# Patient Record
Sex: Female | Born: 1935 | Race: Black or African American | Hispanic: No | Smoking: Never smoker
Health system: Southern US, Community
[De-identification: ages and names within clinical notes are randomized; demographics above are authoritative.]

## PROBLEM LIST (undated history)

## (undated) DIAGNOSIS — E785 Hyperlipidemia, unspecified: Secondary | ICD-10-CM

## (undated) DIAGNOSIS — I10 Essential (primary) hypertension: Secondary | ICD-10-CM

## (undated) DIAGNOSIS — H9190 Unspecified hearing loss, unspecified ear: Secondary | ICD-10-CM

## (undated) DIAGNOSIS — R000 Tachycardia, unspecified: Secondary | ICD-10-CM

## (undated) HISTORY — DX: Tachycardia, unspecified: R00.0

## (undated) HISTORY — DX: Unspecified hearing loss, unspecified ear: H91.90

## (undated) HISTORY — DX: Hyperlipidemia, unspecified: E78.5

## (undated) HISTORY — PX: CATARACT EXTRACTION: SUR2

---

## 1997-09-22 ENCOUNTER — Other Ambulatory Visit: Admission: RE | Admit: 1997-09-22 | Discharge: 1997-09-22 | Payer: Self-pay | Admitting: Obstetrics & Gynecology

## 1998-09-28 ENCOUNTER — Other Ambulatory Visit: Admission: RE | Admit: 1998-09-28 | Discharge: 1998-09-28 | Payer: Self-pay | Admitting: Obstetrics & Gynecology

## 1999-11-26 ENCOUNTER — Other Ambulatory Visit: Admission: RE | Admit: 1999-11-26 | Discharge: 1999-11-26 | Payer: Self-pay | Admitting: Obstetrics & Gynecology

## 2000-01-10 ENCOUNTER — Encounter: Payer: Self-pay | Admitting: Obstetrics & Gynecology

## 2000-01-10 ENCOUNTER — Encounter: Admission: RE | Admit: 2000-01-10 | Discharge: 2000-01-10 | Payer: Self-pay | Admitting: Obstetrics & Gynecology

## 2000-09-01 ENCOUNTER — Encounter (INDEPENDENT_AMBULATORY_CARE_PROVIDER_SITE_OTHER): Payer: Self-pay | Admitting: Specialist

## 2000-09-01 ENCOUNTER — Ambulatory Visit (HOSPITAL_COMMUNITY): Admission: RE | Admit: 2000-09-01 | Discharge: 2000-09-01 | Payer: Self-pay | Admitting: Obstetrics & Gynecology

## 2000-12-25 ENCOUNTER — Other Ambulatory Visit: Admission: RE | Admit: 2000-12-25 | Discharge: 2000-12-25 | Payer: Self-pay | Admitting: Obstetrics & Gynecology

## 2001-01-11 ENCOUNTER — Encounter: Admission: RE | Admit: 2001-01-11 | Discharge: 2001-01-11 | Payer: Self-pay | Admitting: Obstetrics & Gynecology

## 2001-01-11 ENCOUNTER — Encounter: Payer: Self-pay | Admitting: Obstetrics & Gynecology

## 2001-05-26 ENCOUNTER — Emergency Department (HOSPITAL_COMMUNITY): Admission: EM | Admit: 2001-05-26 | Discharge: 2001-05-26 | Payer: Self-pay | Admitting: Emergency Medicine

## 2001-05-26 ENCOUNTER — Encounter: Payer: Self-pay | Admitting: Emergency Medicine

## 2002-02-07 ENCOUNTER — Encounter: Admission: RE | Admit: 2002-02-07 | Discharge: 2002-02-07 | Payer: Self-pay | Admitting: Obstetrics & Gynecology

## 2002-02-07 ENCOUNTER — Encounter: Payer: Self-pay | Admitting: Obstetrics & Gynecology

## 2002-02-07 ENCOUNTER — Other Ambulatory Visit: Admission: RE | Admit: 2002-02-07 | Discharge: 2002-02-07 | Payer: Self-pay | Admitting: Obstetrics & Gynecology

## 2003-02-09 ENCOUNTER — Encounter: Admission: RE | Admit: 2003-02-09 | Discharge: 2003-02-09 | Payer: Self-pay | Admitting: Obstetrics & Gynecology

## 2003-02-24 ENCOUNTER — Other Ambulatory Visit: Admission: RE | Admit: 2003-02-24 | Discharge: 2003-02-24 | Payer: Self-pay | Admitting: Obstetrics & Gynecology

## 2004-02-16 ENCOUNTER — Encounter: Admission: RE | Admit: 2004-02-16 | Discharge: 2004-02-16 | Payer: Self-pay | Admitting: Obstetrics & Gynecology

## 2005-03-24 ENCOUNTER — Encounter: Admission: RE | Admit: 2005-03-24 | Discharge: 2005-03-24 | Payer: Self-pay | Admitting: Obstetrics & Gynecology

## 2005-05-09 ENCOUNTER — Other Ambulatory Visit: Admission: RE | Admit: 2005-05-09 | Discharge: 2005-05-09 | Payer: Self-pay | Admitting: Obstetrics & Gynecology

## 2006-03-26 ENCOUNTER — Encounter: Admission: RE | Admit: 2006-03-26 | Discharge: 2006-03-26 | Payer: Self-pay | Admitting: Internal Medicine

## 2007-04-20 ENCOUNTER — Encounter: Admission: RE | Admit: 2007-04-20 | Discharge: 2007-04-20 | Payer: Self-pay | Admitting: Internal Medicine

## 2008-05-05 ENCOUNTER — Encounter: Admission: RE | Admit: 2008-05-05 | Discharge: 2008-05-05 | Payer: Self-pay | Admitting: Obstetrics & Gynecology

## 2009-05-25 ENCOUNTER — Encounter: Admission: RE | Admit: 2009-05-25 | Discharge: 2009-05-25 | Payer: Self-pay | Admitting: Obstetrics & Gynecology

## 2010-06-05 ENCOUNTER — Other Ambulatory Visit: Payer: Self-pay | Admitting: Obstetrics & Gynecology

## 2010-06-05 DIAGNOSIS — Z1231 Encounter for screening mammogram for malignant neoplasm of breast: Secondary | ICD-10-CM

## 2010-06-14 ENCOUNTER — Ambulatory Visit
Admission: RE | Admit: 2010-06-14 | Discharge: 2010-06-14 | Disposition: A | Discharge status: Home / Self Care | Payer: BC Managed Care – PPO | Source: Ambulatory Visit | Attending: Obstetrics & Gynecology | Admitting: Obstetrics & Gynecology

## 2010-06-14 DIAGNOSIS — Z1231 Encounter for screening mammogram for malignant neoplasm of breast: Secondary | ICD-10-CM

## 2010-08-30 NOTE — Op Note (Signed)
[facility]  Patient:    Kristie Hicks, Kristie Hicks                    MRN: [id number] Proc. Date: 09/01/00 Adm. Date:  [id number] Attending:  Minette Headland                           Operative Report  PREOPERATIVE DIAGNOSIS:       Postmenopausal bleeding, endometrial thickening by ultrasound, known uterine leiomyomata which were intramural.  POSTOPERATIVE DIAGNOSIS:      Postmenopausal bleeding, endometrial thickening by ultrasound, known uterine leiomyomata which were intramural with large endometrial polyp which prolapsed into the endocervical canal.  OPERATION:                    Hysteroscopy dilation and curettage.  SURGEON:                      Freddy Finner, M.D.  ANESTHESIA:                   ______ intravenous sedation and paracervical block.  ESTIMATED BLOOD LOSS:         10 cc.  ______ -40 cc.  INDICATIONS:                  The patient is a 77 year old with an episode of postmenopausal spotting.  Evaluation on ultrasound had thickening of the endometrium to 6.4 mm.  She was admitted now for hysteroscopy D&C.  She was admitted on the morning of surgery.  DESCRIPTION OF PROCEDURE:     She was brought to the operating room, and there placed under adequate intravenous sedation and placed in the dorsolithotomy position using an [employer] system.  Paracervical block was placed using a total of 10 cc of 1% Xylocaine, half injected at 4 and half at 8 oclock in the vaginal fornices.  The cervix was grasped with a single-tooth tenaculum, progressively dilated to 23 with Pratt dilators.  The 12-1/2 degree [facility] hysteroscope was introduced using 3% sorbitol as ______.  The polyp was immediately visible in the endocervical canal and it was followed into the endometrium and was probably 3 cm in length and 1 x 0.5 cm in lateral and vertical dimensions.  Using a polyp forceps, the polyp was grasped and removed.  This was repeated three or four  times to make sure that essentially all the polyp was removed.  Further inspection of the endometrium was then carried out using the hysteroscope and gentle thorough curettage and exploration with Randall stone forceps confirmed complete sampling and excision of the polyp.  The instruments were then removed.  The patient was taken to the recovery room in good condition and will be discharged in the immediate postop period with routine outpatient surgical instructions.  She is to use ibuprofen or Darvocet for postoperative pain.  She is to see me in approximately one week. DD:  09/01/00 TD:  09/01/00 Job: [id number]

## 2011-01-29 ENCOUNTER — Emergency Department (HOSPITAL_COMMUNITY)
Admit: 2011-01-29 | Discharge: 2011-01-29 | Disposition: A | Discharge status: Home / Self Care | Payer: BC Managed Care – PPO | Attending: Emergency Medicine | Admitting: Emergency Medicine

## 2011-01-29 ENCOUNTER — Emergency Department (HOSPITAL_COMMUNITY): Payer: BC Managed Care – PPO

## 2011-01-29 DIAGNOSIS — R0789 Other chest pain: Secondary | ICD-10-CM | POA: Insufficient documentation

## 2011-01-29 DIAGNOSIS — R209 Unspecified disturbances of skin sensation: Secondary | ICD-10-CM | POA: Insufficient documentation

## 2011-01-29 DIAGNOSIS — I10 Essential (primary) hypertension: Secondary | ICD-10-CM | POA: Insufficient documentation

## 2011-01-29 DIAGNOSIS — R002 Palpitations: Secondary | ICD-10-CM | POA: Insufficient documentation

## 2011-01-29 DIAGNOSIS — E785 Hyperlipidemia, unspecified: Secondary | ICD-10-CM | POA: Insufficient documentation

## 2011-01-29 LAB — POCT I-STAT, CHEM 8
Chloride: 103 mEq/L (ref 96–112)
Creatinine, Ser: 0.9 mg/dL (ref 0.50–1.10)
Glucose, Bld: 111 mg/dL — ABNORMAL HIGH (ref 70–99)
Potassium: 3.7 mEq/L (ref 3.5–5.1)
TCO2: 26 mmol/L (ref 0–100)

## 2011-01-29 LAB — CBC
HCT: 34 % — ABNORMAL LOW (ref 36.0–46.0)
Hemoglobin: 11.1 g/dL — ABNORMAL LOW (ref 12.0–15.0)
MCHC: 32.6 g/dL (ref 30.0–36.0)
Platelets: 212 10*3/uL (ref 150–400)
RBC: 3.76 MIL/uL — ABNORMAL LOW (ref 3.87–5.11)
RDW: 14.2 % (ref 11.5–15.5)

## 2011-01-29 LAB — URINE MICROSCOPIC-ADD ON

## 2011-01-29 LAB — DIFFERENTIAL
Basophils Absolute: 0 10*3/uL (ref 0.0–0.1)
Lymphs Abs: 0.8 10*3/uL (ref 0.7–4.0)
Monocytes Relative: 6 % (ref 3–12)
Neutrophils Relative %: 75 % (ref 43–77)

## 2011-01-29 LAB — URINALYSIS, ROUTINE W REFLEX MICROSCOPIC
Glucose, UA: NEGATIVE mg/dL
Nitrite: NEGATIVE
Specific Gravity, Urine: 1.008 (ref 1.005–1.030)
Urobilinogen, UA: 0.2 mg/dL (ref 0.0–1.0)

## 2011-01-29 LAB — POCT I-STAT TROPONIN I: Troponin i, poc: 0.01 ng/mL (ref 0.00–0.08)

## 2011-06-06 ENCOUNTER — Other Ambulatory Visit: Payer: Self-pay | Admitting: Internal Medicine

## 2011-06-06 DIAGNOSIS — Z1231 Encounter for screening mammogram for malignant neoplasm of breast: Secondary | ICD-10-CM

## 2011-07-04 ENCOUNTER — Ambulatory Visit
Admission: RE | Admit: 2011-07-04 | Discharge: 2011-07-04 | Disposition: A | Discharge status: Home / Self Care | Payer: BC Managed Care – PPO | Source: Ambulatory Visit | Attending: Internal Medicine | Admitting: Internal Medicine

## 2011-07-04 DIAGNOSIS — Z1231 Encounter for screening mammogram for malignant neoplasm of breast: Secondary | ICD-10-CM

## 2011-08-07 ENCOUNTER — Emergency Department (HOSPITAL_COMMUNITY)
Admission: EM | Admit: 2011-08-07 | Discharge: 2011-08-07 | Disposition: A | Discharge status: Home / Self Care | Payer: BC Managed Care – PPO | Attending: Emergency Medicine | Admitting: Emergency Medicine

## 2011-08-07 ENCOUNTER — Encounter (HOSPITAL_COMMUNITY): Payer: Self-pay | Admitting: Emergency Medicine

## 2011-08-07 ENCOUNTER — Emergency Department (HOSPITAL_COMMUNITY): Payer: BC Managed Care – PPO

## 2011-08-07 DIAGNOSIS — S8000XA Contusion of unspecified knee, initial encounter: Secondary | ICD-10-CM | POA: Insufficient documentation

## 2011-08-07 DIAGNOSIS — S0120XA Unspecified open wound of nose, initial encounter: Secondary | ICD-10-CM | POA: Insufficient documentation

## 2011-08-07 DIAGNOSIS — I10 Essential (primary) hypertension: Secondary | ICD-10-CM | POA: Insufficient documentation

## 2011-08-07 DIAGNOSIS — S0083XA Contusion of other part of head, initial encounter: Secondary | ICD-10-CM

## 2011-08-07 DIAGNOSIS — M25569 Pain in unspecified knee: Secondary | ICD-10-CM | POA: Insufficient documentation

## 2011-08-07 DIAGNOSIS — S0180XA Unspecified open wound of other part of head, initial encounter: Secondary | ICD-10-CM | POA: Insufficient documentation

## 2011-08-07 DIAGNOSIS — Y92410 Unspecified street and highway as the place of occurrence of the external cause: Secondary | ICD-10-CM | POA: Insufficient documentation

## 2011-08-07 DIAGNOSIS — IMO0001 Reserved for inherently not codable concepts without codable children: Secondary | ICD-10-CM | POA: Insufficient documentation

## 2011-08-07 DIAGNOSIS — S8010XA Contusion of unspecified lower leg, initial encounter: Secondary | ICD-10-CM | POA: Insufficient documentation

## 2011-08-07 DIAGNOSIS — M79609 Pain in unspecified limb: Secondary | ICD-10-CM | POA: Insufficient documentation

## 2011-08-07 DIAGNOSIS — R51 Headache: Secondary | ICD-10-CM | POA: Insufficient documentation

## 2011-08-07 DIAGNOSIS — S0003XA Contusion of scalp, initial encounter: Secondary | ICD-10-CM | POA: Insufficient documentation

## 2011-08-07 DIAGNOSIS — R079 Chest pain, unspecified: Secondary | ICD-10-CM | POA: Insufficient documentation

## 2011-08-07 HISTORY — DX: Essential (primary) hypertension: I10

## 2011-08-07 LAB — POCT I-STAT, CHEM 8
Calcium, Ion: 1.28 mmol/L (ref 1.12–1.32)
Chloride: 107 mEq/L (ref 96–112)
Glucose, Bld: 130 mg/dL — ABNORMAL HIGH (ref 70–99)
TCO2: 23 mmol/L (ref 0–100)

## 2011-08-07 MED ORDER — TRAMADOL HCL 50 MG PO TABS: 50.0000 mg | ORAL_TABLET | Freq: Four times a day (QID) | ORAL | Status: AC | PRN

## 2011-08-07 MED ORDER — ACETAMINOPHEN 325 MG PO TABS
650.0000 mg | ORAL_TABLET | Freq: Once | ORAL | Status: AC
  Administered 2011-08-07: 650 mg via ORAL
  Filled 2011-08-07: qty 2
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
## 2011-08-07 NOTE — Discharge Instructions (Signed)
Also noted was nonspecific thyroid nodule. Recommend discussion with PCP regarding outpatient ultrasound.

## 2011-08-07 NOTE — ED Notes (Signed)
Family at bedside. 

## 2011-08-09 NOTE — ED Provider Notes (Signed)
I saw and evaluated the patient, reviewed the resident's note and I agree with the findings and plan.   .Face to face Exam:  General:  Awake  Resp:  Normal effort Abd:  Nondistended Neuro:No focal weakness Lymph: No adenopathy   Nelia Shi, MD 08/09/11 [phone]

## 2012-07-29 ENCOUNTER — Other Ambulatory Visit: Payer: Self-pay

## 2012-07-29 DIAGNOSIS — Z1231 Encounter for screening mammogram for malignant neoplasm of breast: Secondary | ICD-10-CM

## 2012-08-06 ENCOUNTER — Ambulatory Visit
Admission: RE | Admit: 2012-08-06 | Discharge: 2012-08-06 | Disposition: A | Discharge status: Home / Self Care | Payer: Medicare Other | Source: Ambulatory Visit

## 2012-08-06 DIAGNOSIS — Z1231 Encounter for screening mammogram for malignant neoplasm of breast: Secondary | ICD-10-CM

## 2013-07-29 ENCOUNTER — Other Ambulatory Visit: Payer: Self-pay

## 2013-07-29 DIAGNOSIS — Z1231 Encounter for screening mammogram for malignant neoplasm of breast: Secondary | ICD-10-CM

## 2013-08-01 ENCOUNTER — Ambulatory Visit
Admission: RE | Admit: 2013-08-01 | Discharge: 2013-08-01 | Disposition: A | Discharge status: Home / Self Care | Payer: Medicare Other | Source: Ambulatory Visit

## 2013-08-01 DIAGNOSIS — Z1231 Encounter for screening mammogram for malignant neoplasm of breast: Secondary | ICD-10-CM

## 2013-11-02 ENCOUNTER — Encounter: Payer: Self-pay | Admitting: *Deleted

## 2014-09-20 ENCOUNTER — Other Ambulatory Visit: Payer: Self-pay

## 2014-09-20 DIAGNOSIS — Z1231 Encounter for screening mammogram for malignant neoplasm of breast: Secondary | ICD-10-CM

## 2014-09-28 ENCOUNTER — Ambulatory Visit: Payer: 59

## 2014-12-19 ENCOUNTER — Emergency Department (HOSPITAL_COMMUNITY): Payer: No Typology Code available for payment source

## 2014-12-19 ENCOUNTER — Emergency Department (HOSPITAL_COMMUNITY)
Admission: EM | Admit: 2014-12-19 | Discharge: 2014-12-19 | Disposition: A | Discharge status: Home / Self Care | Payer: No Typology Code available for payment source | Attending: Emergency Medicine | Admitting: Emergency Medicine

## 2014-12-19 ENCOUNTER — Encounter (HOSPITAL_COMMUNITY): Payer: Self-pay | Admitting: Emergency Medicine

## 2014-12-19 DIAGNOSIS — Z79899 Other long term (current) drug therapy: Secondary | ICD-10-CM | POA: Diagnosis not present

## 2014-12-19 DIAGNOSIS — E785 Hyperlipidemia, unspecified: Secondary | ICD-10-CM | POA: Insufficient documentation

## 2014-12-19 DIAGNOSIS — Y998 Other external cause status: Secondary | ICD-10-CM | POA: Insufficient documentation

## 2014-12-19 DIAGNOSIS — S8012XA Contusion of left lower leg, initial encounter: Secondary | ICD-10-CM | POA: Diagnosis not present

## 2014-12-19 DIAGNOSIS — S299XXA Unspecified injury of thorax, initial encounter: Secondary | ICD-10-CM | POA: Diagnosis not present

## 2014-12-19 DIAGNOSIS — Y9389 Activity, other specified: Secondary | ICD-10-CM | POA: Diagnosis not present

## 2014-12-19 DIAGNOSIS — I10 Essential (primary) hypertension: Secondary | ICD-10-CM | POA: Insufficient documentation

## 2014-12-19 DIAGNOSIS — S8992XA Unspecified injury of left lower leg, initial encounter: Secondary | ICD-10-CM | POA: Diagnosis present

## 2014-12-19 DIAGNOSIS — H9190 Unspecified hearing loss, unspecified ear: Secondary | ICD-10-CM | POA: Insufficient documentation

## 2014-12-19 DIAGNOSIS — Z7982 Long term (current) use of aspirin: Secondary | ICD-10-CM | POA: Diagnosis not present

## 2014-12-19 DIAGNOSIS — Y92410 Unspecified street and highway as the place of occurrence of the external cause: Secondary | ICD-10-CM | POA: Diagnosis not present

## 2014-12-19 DIAGNOSIS — S3991XA Unspecified injury of abdomen, initial encounter: Secondary | ICD-10-CM | POA: Insufficient documentation

## 2014-12-19 LAB — BASIC METABOLIC PANEL
Anion gap: 7 (ref 5–15)
BUN: 33 mg/dL — AB (ref 6–20)
CALCIUM: 9.8 mg/dL (ref 8.9–10.3)
CHLORIDE: 106 mmol/L (ref 101–111)
CO2: 25 mmol/L (ref 22–32)
CREATININE: 1.19 mg/dL — AB (ref 0.44–1.00)
GFR calc Af Amer: 48 mL/min — ABNORMAL LOW (ref >60)
GFR calc non Af Amer: 41 mL/min — ABNORMAL LOW (ref >60)
Glucose, Bld: 103 mg/dL — ABNORMAL HIGH (ref 65–99)
Potassium: 3.7 mmol/L (ref 3.5–5.1)
SODIUM: 138 mmol/L (ref 135–145)

## 2014-12-19 LAB — CBC
HEMATOCRIT: 31.8 % — AB (ref 36.0–46.0)
HEMOGLOBIN: 9.9 g/dL — AB (ref 12.0–15.0)
MCH: 28.9 pg (ref 26.0–34.0)
MCHC: 31.1 g/dL (ref 30.0–36.0)
MCV: 93 fL (ref 78.0–100.0)
Platelets: 187 10*3/uL (ref 150–400)
RBC: 3.42 MIL/uL — ABNORMAL LOW (ref 3.87–5.11)
RDW: 13.7 % (ref 11.5–15.5)
WBC: 4.2 10*3/uL (ref 4.0–10.5)

## 2014-12-19 MED ORDER — IOHEXOL 300 MG/ML  SOLN
100.0000 mL | Freq: Once | INTRAMUSCULAR | Status: AC | PRN
  Administered 2014-12-19: 100 mL via INTRAVENOUS

## 2014-12-19 MED ORDER — HYDROCODONE-ACETAMINOPHEN 5-325 MG PO TABS
2.0000 | ORAL_TABLET | Freq: Once | ORAL | Status: DC
  Filled 2014-12-19: qty 2

## 2014-12-19 MED ORDER — ACETAMINOPHEN 500 MG PO TABS
1000.0000 mg | ORAL_TABLET | Freq: Once | ORAL | Status: AC
  Administered 2014-12-19: 1000 mg via ORAL
  Filled 2014-12-19: qty 2

## 2014-12-19 NOTE — ED Notes (Signed)
Patient returned from CT
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
## 2014-12-19 NOTE — Discharge Instructions (Signed)

## 2015-01-09 ENCOUNTER — Encounter (HOSPITAL_COMMUNITY): Payer: Self-pay | Admitting: Emergency Medicine

## 2015-01-09 DIAGNOSIS — S301XXA Contusion of abdominal wall, initial encounter: Secondary | ICD-10-CM | POA: Insufficient documentation

## 2015-01-09 DIAGNOSIS — H9190 Unspecified hearing loss, unspecified ear: Secondary | ICD-10-CM | POA: Diagnosis not present

## 2015-01-09 DIAGNOSIS — K5900 Constipation, unspecified: Secondary | ICD-10-CM | POA: Diagnosis not present

## 2015-01-09 DIAGNOSIS — X58XXXA Exposure to other specified factors, initial encounter: Secondary | ICD-10-CM | POA: Diagnosis not present

## 2015-01-09 DIAGNOSIS — Z7982 Long term (current) use of aspirin: Secondary | ICD-10-CM | POA: Diagnosis not present

## 2015-01-09 DIAGNOSIS — Y9389 Activity, other specified: Secondary | ICD-10-CM | POA: Diagnosis not present

## 2015-01-09 DIAGNOSIS — K640 First degree hemorrhoids: Secondary | ICD-10-CM | POA: Diagnosis not present

## 2015-01-09 DIAGNOSIS — E785 Hyperlipidemia, unspecified: Secondary | ICD-10-CM | POA: Insufficient documentation

## 2015-01-09 DIAGNOSIS — I10 Essential (primary) hypertension: Secondary | ICD-10-CM | POA: Diagnosis not present

## 2015-01-09 DIAGNOSIS — Y998 Other external cause status: Secondary | ICD-10-CM | POA: Insufficient documentation

## 2015-01-09 DIAGNOSIS — Y929 Unspecified place or not applicable: Secondary | ICD-10-CM | POA: Insufficient documentation

## 2015-01-09 DIAGNOSIS — Z79899 Other long term (current) drug therapy: Secondary | ICD-10-CM | POA: Insufficient documentation

## 2015-01-09 NOTE — ED Notes (Signed)
Pt. reports constipation this week with hard stools / rectal pressure , denies abdominal pain , no fever or chills.

## 2015-01-10 ENCOUNTER — Emergency Department (HOSPITAL_COMMUNITY): Payer: Medicare Other

## 2015-01-10 ENCOUNTER — Encounter (HOSPITAL_COMMUNITY): Payer: Self-pay | Admitting: Emergency Medicine

## 2015-01-10 ENCOUNTER — Emergency Department (HOSPITAL_COMMUNITY)
Admission: EM | Admit: 2015-01-10 | Discharge: 2015-01-10 | Disposition: A | Discharge status: Home / Self Care | Payer: Medicare Other | Attending: Emergency Medicine | Admitting: Emergency Medicine

## 2015-01-10 DIAGNOSIS — K640 First degree hemorrhoids: Secondary | ICD-10-CM

## 2015-01-10 DIAGNOSIS — K5904 Chronic idiopathic constipation: Secondary | ICD-10-CM

## 2015-01-10 DIAGNOSIS — K5900 Constipation, unspecified: Secondary | ICD-10-CM | POA: Diagnosis not present

## 2015-01-10 LAB — CBC WITH DIFFERENTIAL/PLATELET
Basophils Absolute: 0 10*3/uL (ref 0.0–0.1)
Basophils Relative: 0 %
EOS PCT: 2 %
Eosinophils Absolute: 0.1 10*3/uL (ref 0.0–0.7)
HEMATOCRIT: 32.4 % — AB (ref 36.0–46.0)
Hemoglobin: 10.1 g/dL — ABNORMAL LOW (ref 12.0–15.0)
LYMPHS ABS: 1.5 10*3/uL (ref 0.7–4.0)
LYMPHS PCT: 33 %
MCH: 29.5 pg (ref 26.0–34.0)
MCHC: 31.2 g/dL (ref 30.0–36.0)
MCV: 94.7 fL (ref 78.0–100.0)
MONO ABS: 0.3 10*3/uL (ref 0.1–1.0)
MONOS PCT: 7 %
NEUTROS ABS: 2.7 10*3/uL (ref 1.7–7.7)
Neutrophils Relative %: 58 %
PLATELETS: 222 10*3/uL (ref 150–400)
RBC: 3.42 MIL/uL — ABNORMAL LOW (ref 3.87–5.11)
RDW: 14.2 % (ref 11.5–15.5)
WBC: 4.6 10*3/uL (ref 4.0–10.5)

## 2015-01-10 LAB — COMPREHENSIVE METABOLIC PANEL
ALT: 15 U/L (ref 14–54)
AST: 20 U/L (ref 15–41)
Albumin: 3.9 g/dL (ref 3.5–5.0)
Alkaline Phosphatase: 44 U/L (ref 38–126)
Anion gap: 9 (ref 5–15)
BILIRUBIN TOTAL: 0.7 mg/dL (ref 0.3–1.2)
BUN: 33 mg/dL — AB (ref 6–20)
CHLORIDE: 100 mmol/L — AB (ref 101–111)
CO2: 27 mmol/L (ref 22–32)
CREATININE: 1.42 mg/dL — AB (ref 0.44–1.00)
Calcium: 9.8 mg/dL (ref 8.9–10.3)
GFR, EST AFRICAN AMERICAN: 39 mL/min — AB (ref >60)
GFR, EST NON AFRICAN AMERICAN: 33 mL/min — AB (ref >60)
Glucose, Bld: 127 mg/dL — ABNORMAL HIGH (ref 65–99)
POTASSIUM: 3.9 mmol/L (ref 3.5–5.1)
Sodium: 136 mmol/L (ref 135–145)
TOTAL PROTEIN: 6.8 g/dL (ref 6.5–8.1)

## 2015-01-10 MED ORDER — HYDROCORTISONE ACETATE 25 MG RE SUPP
25.0000 mg | Freq: Once | RECTAL | Status: AC
  Administered 2015-01-10: 25 mg via RECTAL
  Filled 2015-01-10: qty 1

## 2015-01-10 MED ORDER — HYDROCORTISONE ACETATE 25 MG RE SUPP: 25.0000 mg | Freq: Two times a day (BID) | RECTAL | Status: DC

## 2015-01-10 MED ORDER — DOCUSATE SODIUM 100 MG PO CAPS: 100.0000 mg | ORAL_CAPSULE | Freq: Two times a day (BID) | ORAL | Status: DC

## 2015-01-10 MED ORDER — POLYETHYLENE GLYCOL 3350 17 GM/SCOOP PO POWD: 17.0000 g | Freq: Every day | ORAL | Status: AC

## 2015-01-10 MED ORDER — ACETAMINOPHEN 500 MG PO TABS
1000.0000 mg | ORAL_TABLET | Freq: Once | ORAL | Status: AC
  Administered 2015-01-10: 1000 mg via ORAL
  Filled 2015-01-10: qty 2
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
## 2015-01-10 NOTE — Discharge Instructions (Signed)

## 2015-01-10 NOTE — ED Notes (Signed)
Pt. Left with all belongings. Discharge instructions were reviewed and all questions were answered.  

## 2015-02-12 ENCOUNTER — Other Ambulatory Visit: Payer: Self-pay | Admitting: Internal Medicine

## 2015-02-21 ENCOUNTER — Other Ambulatory Visit: Payer: Self-pay | Admitting: Internal Medicine

## 2015-02-21 DIAGNOSIS — R160 Hepatomegaly, not elsewhere classified: Secondary | ICD-10-CM

## 2015-03-01 ENCOUNTER — Ambulatory Visit
Admission: RE | Admit: 2015-03-01 | Discharge: 2015-03-01 | Disposition: A | Discharge status: Home / Self Care | Payer: Medicare Other | Source: Ambulatory Visit | Attending: Internal Medicine | Admitting: Internal Medicine

## 2015-03-01 DIAGNOSIS — R160 Hepatomegaly, not elsewhere classified: Secondary | ICD-10-CM

## 2015-09-17 ENCOUNTER — Ambulatory Visit
Admission: RE | Admit: 2015-09-17 | Discharge: 2015-09-17 | Disposition: A | Discharge status: Home / Self Care | Payer: Medicare Other | Source: Ambulatory Visit

## 2015-09-17 DIAGNOSIS — Z1231 Encounter for screening mammogram for malignant neoplasm of breast: Secondary | ICD-10-CM

## 2016-01-02 ENCOUNTER — Other Ambulatory Visit: Payer: Self-pay | Admitting: Internal Medicine

## 2016-01-02 DIAGNOSIS — R918 Other nonspecific abnormal finding of lung field: Secondary | ICD-10-CM

## 2016-01-02 DIAGNOSIS — Z09 Encounter for follow-up examination after completed treatment for conditions other than malignant neoplasm: Secondary | ICD-10-CM

## 2016-01-17 ENCOUNTER — Ambulatory Visit
Admission: RE | Admit: 2016-01-17 | Discharge: 2016-01-17 | Disposition: A | Discharge status: Home / Self Care | Payer: Medicare Other | Source: Ambulatory Visit | Attending: Internal Medicine | Admitting: Internal Medicine

## 2016-01-17 ENCOUNTER — Other Ambulatory Visit: Payer: Self-pay | Admitting: Internal Medicine

## 2016-01-17 DIAGNOSIS — R918 Other nonspecific abnormal finding of lung field: Secondary | ICD-10-CM

## 2016-01-17 DIAGNOSIS — Z09 Encounter for follow-up examination after completed treatment for conditions other than malignant neoplasm: Secondary | ICD-10-CM
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
## 2016-10-13 ENCOUNTER — Ambulatory Visit (HOSPITAL_COMMUNITY)
Admission: EM | Admit: 2016-10-13 | Discharge: 2016-10-13 | Disposition: A | Discharge status: Home / Self Care | Payer: Medicare Other

## 2016-10-13 ENCOUNTER — Encounter (HOSPITAL_COMMUNITY): Payer: Self-pay | Admitting: Emergency Medicine

## 2016-10-13 DIAGNOSIS — J301 Allergic rhinitis due to pollen: Secondary | ICD-10-CM | POA: Diagnosis not present

## 2016-10-13 NOTE — ED Provider Notes (Signed)
CSN: [id number]     Arrival date & time 10/13/16  1942 History   First MD Initiated Contact with Patient 10/13/16 2033     Chief Complaint  Patient presents with  . Facial Pain   (Consider location/radiation/quality/duration/timing/severity/associated sxs/prior Treatment) 83 year old female complaining of some pressure under the eyes and sometimes dizziness and nasal congestion. Denies sore throat and denies PND elevation is having to clear her throat frequently. Denies fever or chills. She states she sits outside on most days.      Past Medical History:  Diagnosis Date  . HL (hearing loss)   . Hyperlipidemia   . Hypertension   . Rapid heart rate    Past Surgical History:  Procedure Laterality Date  . CATARACT EXTRACTION     History reviewed. No pertinent family history. Social History  Substance Use Topics  . Smoking status: Never Smoker  . Smokeless tobacco: Not on file  . Alcohol use No   OB History    No data available     Review of Systems  Constitutional: Negative for activity change, chills and fever.  HENT: Positive for congestion and sinus pressure. Negative for rhinorrhea and sore throat.        Pressure in the ears  Respiratory: Negative.   Cardiovascular: Negative.   Gastrointestinal: Negative.   Musculoskeletal:       As per HPI  Skin: Negative for color change, pallor and rash.  Neurological: Negative.   All other systems reviewed and are negative.   Allergies  Mobic [meloxicam] and Robaxin [methocarbamol]  Home Medications   Prior to Admission medications   Medication Sig Start Date End Date Taking? Authorizing Provider  aspirin EC 81 MG tablet Take 81 mg by mouth every morning.    [provider]  atorvastatin (LIPITOR) 20 MG tablet Take 20 mg by mouth every morning.    [provider]  docusate sodium (COLACE) 100 MG capsule Take 1 capsule (100 mg total) by mouth every 12 (twelve) hours. 01/10/15   Palumbo, April, MD   hydrocortisone (ANUSOL-HC) 25 MG suppository Place 1 suppository (25 mg total) rectally 2 (two) times daily. For 7 days 01/10/15   Nicanor Alcon, April, MD  lisinopril-hydrochlorothiazide (PRINZIDE,ZESTORETIC) 20-12.5 MG per tablet Take 1 tablet by mouth every morning.    [provider]  polyethylene glycol powder (MIRALAX) powder Take 17 g by mouth daily. 01/10/15   Palumbo, April, MD   Meds Ordered and Administered this Visit  Medications - No data to display  BP (!) 129/95 (BP Location: Left Arm)   Pulse 80   Temp 98.9 F (37.2 C) (Oral)   Resp 18   SpO2 100%  No data found.   Physical Exam  Constitutional: She is oriented to person, place, and time. She appears well-developed and well-nourished. No distress.  HENT:  Mouth/Throat: No oropharyngeal exudate.  Bilateral TMs with minor retraction otherwise normal. Pharynx with moderate amount of clear PND and minor cobblestoning.  Eyes: EOM are normal. Pupils are equal, round, and reactive to light.  Conjunctiva with bilateral mild to moderate erythema. No drainage  Neck: Normal range of motion. Neck supple.  Cardiovascular: Normal rate and regular rhythm.   Murmur heard. Pulmonary/Chest: Effort normal and breath sounds normal. No respiratory distress. She has no wheezes.  Musculoskeletal: Normal range of motion. She exhibits no edema.  Lymphadenopathy:    She has no cervical adenopathy.  Neurological: She is alert and oriented to person, place, and time.  Skin: Skin is  warm and dry. No rash noted.  Psychiatric: She has a normal mood and affect.  Nursing note and vitals reviewed.   Urgent Care Course     Procedures (including critical care time)  Labs Review Labs Reviewed - No data to display  Imaging Review No results found.   Visual Acuity Review  Right Eye Distance:   Left Eye Distance:   Bilateral Distance:    Right Eye Near:   Left Eye Near:    Bilateral Near:         MDM   1. Seasonal allergic  rhinitis due to pollen    Recommend taking phenylephrine 5 mg every 6 hours as needed for nasal congestion. If he felt like this causes an increase in your heart rate or palpitations stop taking it. Take Allegra or Zyrtec daily as an antihistamine for allergies. Use the eyedrops called Zaditor 1 drop in each eye twice a day for inflammation and scratchy feeling of the eyes. Drink plenty of fluids, stay well-hydrated. For congestion in the nose use saline nasal spray such as patient nasal spray.     Hayden RasmussenMabe, Sharniece Gibbon, NP 10/13/16 [id number]    Hayden RasmussenMabe, Rakim Moone, NP 10/13/16 2050

## 2016-10-13 NOTE — ED Triage Notes (Signed)
[facility] a [facility] sinus pressure and ear pain x 3 days.

## 2016-10-13 NOTE — Discharge Instructions (Signed)
Recommend taking phenylephrine 5 mg every 6 hours as needed for nasal congestion. If he felt like this causes an increase in your heart rate or palpitations stop taking it. Take Allegra or Zyrtec daily as an antihistamine for allergies. Use the eyedrops called Zaditor 1 drop in each eye twice a day for inflammation and scratchy feeling of the eyes. Drink plenty of fluids, stay well-hydrated. For congestion in the nose use saline nasal spray such as patient nasal spray.

## 2016-12-22 ENCOUNTER — Other Ambulatory Visit: Payer: Self-pay | Admitting: Obstetrics & Gynecology

## 2016-12-22 DIAGNOSIS — Z1231 Encounter for screening mammogram for malignant neoplasm of breast: Secondary | ICD-10-CM

## 2017-01-15 ENCOUNTER — Ambulatory Visit
Admission: RE | Admit: 2017-01-15 | Discharge: 2017-01-15 | Disposition: A | Discharge status: Home / Self Care | Payer: Medicare Other | Source: Ambulatory Visit | Attending: Obstetrics & Gynecology | Admitting: Obstetrics & Gynecology

## 2017-01-15 DIAGNOSIS — Z1231 Encounter for screening mammogram for malignant neoplasm of breast: Secondary | ICD-10-CM

## 2018-02-03 ENCOUNTER — Other Ambulatory Visit: Payer: Self-pay | Admitting: Obstetrics & Gynecology

## 2018-02-03 DIAGNOSIS — Z1231 Encounter for screening mammogram for malignant neoplasm of breast: Secondary | ICD-10-CM

## 2018-03-18 ENCOUNTER — Ambulatory Visit
Admission: RE | Admit: 2018-03-18 | Discharge: 2018-03-18 | Disposition: A | Discharge status: Home / Self Care | Payer: Medicare Other | Source: Ambulatory Visit | Attending: Obstetrics & Gynecology | Admitting: Obstetrics & Gynecology

## 2018-03-18 DIAGNOSIS — Z1231 Encounter for screening mammogram for malignant neoplasm of breast: Secondary | ICD-10-CM

## 2019-03-07 ENCOUNTER — Other Ambulatory Visit: Payer: Self-pay | Admitting: Obstetrics & Gynecology

## 2019-03-07 DIAGNOSIS — Z1231 Encounter for screening mammogram for malignant neoplasm of breast: Secondary | ICD-10-CM

## 2019-04-28 ENCOUNTER — Other Ambulatory Visit: Payer: Self-pay

## 2019-04-28 ENCOUNTER — Ambulatory Visit
Admission: RE | Admit: 2019-04-28 | Discharge: 2019-04-28 | Disposition: A | Discharge status: Home / Self Care | Payer: Medicare Other | Source: Ambulatory Visit | Attending: Obstetrics & Gynecology | Admitting: Obstetrics & Gynecology

## 2019-04-28 DIAGNOSIS — Z1231 Encounter for screening mammogram for malignant neoplasm of breast: Secondary | ICD-10-CM

## 2019-06-04 ENCOUNTER — Ambulatory Visit: Payer: Medicare Other | Attending: Internal Medicine

## 2019-06-04 DIAGNOSIS — Z23 Encounter for immunization: Secondary | ICD-10-CM

## 2019-06-04 NOTE — Progress Notes (Signed)
   Covid-19 Vaccination Clinic  Name:  Kristie Hicks    MRN: [id number] DOB: Jul 16, 1930  06/04/2019  Ms. [name] was observed post Covid-19 immunization for 15 minutes without incidence. She was provided with Vaccine Information Sheet and instruction to access the V-Safe system.   Ms. [name] was instructed to call 911 with any severe reactions post vaccine: Marland Kitchen Difficulty breathing  . Swelling of your face and throat  . A fast heartbeat  . A bad rash all over your body  . Dizziness and weakness    Immunizations Administered    Name Date Dose VIS Date Route   Pfizer COVID-19 Vaccine 06/04/2019  3:23 PM 0.3 mL 03/25/2019 Intramuscular   Manufacturer: [employer], [employer]   Lot: [id number]   NDC: [address]-5

## 2019-06-28 ENCOUNTER — Ambulatory Visit: Payer: Medicare Other | Attending: Internal Medicine

## 2019-06-28 DIAGNOSIS — Z23 Encounter for immunization: Secondary | ICD-10-CM

## 2019-06-28 NOTE — Progress Notes (Signed)
   Covid-19 Vaccination Clinic  Name:  Lachandra Dettmann    MRN: [id number] DOB: 05/08/1930  06/28/2019  Ms. Janish was observed post Covid-19 immunization for 15 minutes without incident. She was provided with Vaccine Information Sheet and instruction to access the V-Safe system.   Ms. Kwasnik was instructed to call 911 with any severe reactions post vaccine: Marland Kitchen Difficulty breathing  . Swelling of face and throat  . A fast heartbeat  . A bad rash all over body  . Dizziness and weakness   Immunizations Administered    Name Date Dose VIS Date Route   Pfizer COVID-19 Vaccine 06/28/2019  3:39 PM 0.3 mL 03/25/2019 Intramuscular   Manufacturer: [employer], [employer]   Lot: [id number]   NDC: [address]-8

## 2020-07-18 ENCOUNTER — Other Ambulatory Visit: Payer: Self-pay | Admitting: Obstetrics & Gynecology

## 2020-07-18 DIAGNOSIS — Z1231 Encounter for screening mammogram for malignant neoplasm of breast: Secondary | ICD-10-CM

## 2020-09-07 ENCOUNTER — Ambulatory Visit
Admission: RE | Admit: 2020-09-07 | Discharge: 2020-09-07 | Disposition: A | Discharge status: Home / Self Care | Payer: Medicare Other | Source: Ambulatory Visit | Attending: Obstetrics & Gynecology | Admitting: Obstetrics & Gynecology

## 2020-09-07 ENCOUNTER — Other Ambulatory Visit: Payer: Self-pay

## 2020-09-07 DIAGNOSIS — Z1231 Encounter for screening mammogram for malignant neoplasm of breast: Secondary | ICD-10-CM

## 2021-01-15 ENCOUNTER — Inpatient Hospital Stay (HOSPITAL_BASED_OUTPATIENT_CLINIC_OR_DEPARTMENT_OTHER)
Admission: EM | Admit: 2021-01-15 | Discharge: 2021-01-17 | DRG: 282 | Disposition: A | Discharge status: Home / Self Care | Payer: Medicare Other | Attending: Internal Medicine | Admitting: Internal Medicine

## 2021-01-15 ENCOUNTER — Emergency Department (HOSPITAL_BASED_OUTPATIENT_CLINIC_OR_DEPARTMENT_OTHER): Payer: Medicare Other | Admitting: Radiology

## 2021-01-15 ENCOUNTER — Encounter (HOSPITAL_BASED_OUTPATIENT_CLINIC_OR_DEPARTMENT_OTHER): Payer: Self-pay | Admitting: *Deleted

## 2021-01-15 ENCOUNTER — Other Ambulatory Visit: Payer: Self-pay

## 2021-01-15 ENCOUNTER — Emergency Department (HOSPITAL_BASED_OUTPATIENT_CLINIC_OR_DEPARTMENT_OTHER): Payer: Medicare Other

## 2021-01-15 DIAGNOSIS — R911 Solitary pulmonary nodule: Secondary | ICD-10-CM | POA: Diagnosis present

## 2021-01-15 DIAGNOSIS — R002 Palpitations: Secondary | ICD-10-CM | POA: Diagnosis not present

## 2021-01-15 DIAGNOSIS — R778 Other specified abnormalities of plasma proteins: Secondary | ICD-10-CM | POA: Diagnosis not present

## 2021-01-15 DIAGNOSIS — Z888 Allergy status to other drugs, medicaments and biological substances status: Secondary | ICD-10-CM | POA: Diagnosis not present

## 2021-01-15 DIAGNOSIS — I252 Old myocardial infarction: Secondary | ICD-10-CM | POA: Diagnosis not present

## 2021-01-15 DIAGNOSIS — Z23 Encounter for immunization: Secondary | ICD-10-CM

## 2021-01-15 DIAGNOSIS — D72819 Decreased white blood cell count, unspecified: Secondary | ICD-10-CM | POA: Diagnosis present

## 2021-01-15 DIAGNOSIS — R7989 Other specified abnormal findings of blood chemistry: Secondary | ICD-10-CM | POA: Diagnosis not present

## 2021-01-15 DIAGNOSIS — Z79899 Other long term (current) drug therapy: Secondary | ICD-10-CM | POA: Diagnosis not present

## 2021-01-15 DIAGNOSIS — I2510 Atherosclerotic heart disease of native coronary artery without angina pectoris: Secondary | ICD-10-CM

## 2021-01-15 DIAGNOSIS — I10 Essential (primary) hypertension: Secondary | ICD-10-CM | POA: Diagnosis present

## 2021-01-15 DIAGNOSIS — Z7982 Long term (current) use of aspirin: Secondary | ICD-10-CM | POA: Diagnosis not present

## 2021-01-15 DIAGNOSIS — I25110 Atherosclerotic heart disease of native coronary artery with unstable angina pectoris: Secondary | ICD-10-CM | POA: Diagnosis present

## 2021-01-15 DIAGNOSIS — H9190 Unspecified hearing loss, unspecified ear: Secondary | ICD-10-CM | POA: Diagnosis present

## 2021-01-15 DIAGNOSIS — Z20822 Contact with and (suspected) exposure to covid-19: Secondary | ICD-10-CM | POA: Diagnosis present

## 2021-01-15 DIAGNOSIS — E785 Hyperlipidemia, unspecified: Secondary | ICD-10-CM | POA: Diagnosis present

## 2021-01-15 DIAGNOSIS — I214 Non-ST elevation (NSTEMI) myocardial infarction: Secondary | ICD-10-CM | POA: Diagnosis present

## 2021-01-15 HISTORY — DX: Atherosclerotic heart disease of native coronary artery without angina pectoris: I25.10

## 2021-01-15 LAB — BASIC METABOLIC PANEL
Anion gap: 12 (ref 5–15)
BUN: 28 mg/dL — ABNORMAL HIGH (ref 8–23)
CO2: 28 mmol/L (ref 22–32)
Calcium: 10.5 mg/dL — ABNORMAL HIGH (ref 8.9–10.3)
Chloride: 100 mmol/L (ref 98–111)
Creatinine, Ser: 0.92 mg/dL (ref 0.44–1.00)
GFR, Estimated: 60 mL/min — ABNORMAL LOW (ref >60)
Glucose, Bld: 106 mg/dL — ABNORMAL HIGH (ref 70–99)
Potassium: 3.5 mmol/L (ref 3.5–5.1)
Sodium: 140 mmol/L (ref 135–145)

## 2021-01-15 LAB — CBC
HCT: 39 % (ref 36.0–46.0)
Hemoglobin: 12.4 g/dL (ref 12.0–15.0)
MCH: 29.6 pg (ref 26.0–34.0)
MCHC: 31.8 g/dL (ref 30.0–36.0)
MCV: 93.1 fL (ref 80.0–100.0)
Platelets: 183 10*3/uL (ref 150–400)
RBC: 4.19 MIL/uL (ref 3.87–5.11)
RDW: 13.7 % (ref 11.5–15.5)
WBC: 3.5 10*3/uL — ABNORMAL LOW (ref 4.0–10.5)
nRBC: 0 % (ref 0.0–0.2)

## 2021-01-15 LAB — RESP PANEL BY RT-PCR (FLU A&B, COVID) ARPGX2
Influenza A by PCR: NEGATIVE
Influenza B by PCR: NEGATIVE
SARS Coronavirus 2 by RT PCR: NEGATIVE

## 2021-01-15 LAB — TROPONIN I (HIGH SENSITIVITY)
Troponin I (High Sensitivity): 16 ng/L (ref <18)
Troponin I (High Sensitivity): 89 ng/L — ABNORMAL HIGH (ref <18)
Troponin I (High Sensitivity): 170 ng/L (ref <18)

## 2021-01-15 LAB — D-DIMER, QUANTITATIVE: D-Dimer, Quant: 1.61 ug/mL-FEU — ABNORMAL HIGH (ref 0.00–0.50)

## 2021-01-15 MED ORDER — HEPARIN (PORCINE) 25000 UT/250ML-% IV SOLN
650.0000 [IU]/h | INTRAVENOUS | Status: DC
  Administered 2021-01-15 – 2021-01-16 (×2): 800 [IU]/h via INTRAVENOUS
  Filled 2021-01-15 (×2): qty 250

## 2021-01-15 MED ORDER — SODIUM CHLORIDE 0.9 % IV BOLUS
500.0000 mL | Freq: Once | INTRAVENOUS | Status: AC
  Administered 2021-01-15: 500 mL via INTRAVENOUS

## 2021-01-15 MED ORDER — IOHEXOL 350 MG/ML SOLN
75.0000 mL | Freq: Once | INTRAVENOUS | Status: AC | PRN
  Administered 2021-01-15: 75 mL via INTRAVENOUS

## 2021-01-15 MED ORDER — HYDRALAZINE HCL 20 MG/ML IJ SOLN: 5.0000 mg | INTRAMUSCULAR | Status: DC | PRN

## 2021-01-15 MED ORDER — INFLUENZA VAC A&B SA ADJ QUAD 0.5 ML IM PRSY
0.5000 mL | PREFILLED_SYRINGE | INTRAMUSCULAR | Status: AC
  Administered 2021-01-17: 0.5 mL via INTRAMUSCULAR
  Filled 2021-01-15 (×2): qty 0.5

## 2021-01-15 MED ORDER — ACETAMINOPHEN 650 MG RE SUPP: 650.0000 mg | Freq: Four times a day (QID) | RECTAL | Status: DC | PRN

## 2021-01-15 MED ORDER — ASPIRIN 81 MG PO CHEW
324.0000 mg | CHEWABLE_TABLET | Freq: Once | ORAL | Status: AC
  Administered 2021-01-15: 324 mg via ORAL
  Filled 2021-01-15: qty 4

## 2021-01-15 MED ORDER — ACETAMINOPHEN 325 MG PO TABS: 650.0000 mg | ORAL_TABLET | Freq: Four times a day (QID) | ORAL | Status: DC | PRN

## 2021-01-15 MED ORDER — HEPARIN BOLUS VIA INFUSION
3000.0000 [IU] | Freq: Once | INTRAVENOUS | Status: AC
  Administered 2021-01-15: 3000 [IU] via INTRAVENOUS

## 2021-01-15 NOTE — ED Provider Notes (Signed)
[facility] EMERGENCY DEPT Provider Note   CSN: [id number] Arrival date & time: 01/15/21  1301     History Chief Complaint  Patient presents with   Palpitations    Kristie Hicks is a 87 y.o. female with PMHx HTN and HLD who presents to the ED today with complaint of gradual onset, constant, resolved, heart palpitations that began around 11 AM this morning.  Patient reports she had already woken up and got herself dressed/went to the kitchen to take her medications.  She was sitting on the couch when she began feeling like her heart was beating irregularly.  Patient states that she also felt a heaviness in her left arm.  She states that she called her daughter who left work to come evaluate her and by the time daughter had, around 56 PM the symptoms had gone away.  Daughter states that when she called treatments and she also felt slightly lightheaded however patient cannot recall this today.  She states she currently feels fine.  She denies any history of heart issues in the past.  No recent changes in medications.  Daughter was under the impression that they took her off of one of her blood pressure medications however patient denies this today.  Daughter states patient is acting at baseline.   The history is provided by the patient, a relative and medical records.      Past Medical History:  Diagnosis Date   HL (hearing loss)    Hyperlipidemia    Hypertension    Rapid heart rate     Patient Active Problem List   Diagnosis Date Noted   Chest pain 01/15/2021    Past Surgical History:  Procedure Laterality Date   CATARACT EXTRACTION       OB History   No obstetric history on file.     No family history on file.  Social History   Tobacco Use   Smoking status: Never   Smokeless tobacco: Never  Vaping Use   Vaping Use: Never used  Substance Use Topics   Alcohol use: No   Drug use: Never    Home Medications Prior to Admission medications    Medication Sig Start Date End Date Taking? Authorizing Provider  aspirin EC 81 MG tablet Take 81 mg by mouth every morning.    [provider]  atorvastatin (LIPITOR) 20 MG tablet Take 20 mg by mouth every morning.    [provider]  docusate sodium (COLACE) 100 MG capsule Take 1 capsule (100 mg total) by mouth every 12 (twelve) hours. 01/10/15   Palumbo, April, MD  hydrocortisone (ANUSOL-HC) 25 MG suppository Place 1 suppository (25 mg total) rectally 2 (two) times daily. For 7 days 01/10/15   Nicanor Alcon, April, MD  lisinopril-hydrochlorothiazide (PRINZIDE,ZESTORETIC) 20-12.5 MG per tablet Take 1 tablet by mouth every morning.    [provider]  polyethylene glycol powder (MIRALAX) powder Take 17 g by mouth daily. 01/10/15   Palumbo, April, MD    Allergies    Mobic [meloxicam] and Robaxin [methocarbamol]  Review of Systems   Review of Systems  Constitutional:  Negative for chills, diaphoresis and fever.  Respiratory:  Negative for shortness of breath.   Cardiovascular:  Positive for palpitations. Negative for chest pain and leg swelling.  Gastrointestinal:  Negative for nausea and vomiting.  Neurological:  Positive for light-headedness. Negative for syncope and speech difficulty.  All other systems reviewed and are negative.  Physical Exam Updated Vital Signs BP (!) 179/91 (BP  Location: Left Arm)   Pulse 98   Temp 97.9 F (36.6 C)   Resp 18   Ht 5\' 4"  (1.626 m)   Wt 66.2 kg   SpO2 100%   BMI 25.06 kg/m   Physical Exam Vitals and nursing note reviewed.  Constitutional:      Appearance: She is not ill-appearing or diaphoretic.  HENT:     Head: Normocephalic and atraumatic.  Eyes:     Conjunctiva/sclera: Conjunctivae normal.  Cardiovascular:     Rate and Rhythm: Normal rate and regular rhythm.     Pulses: Normal pulses.  Pulmonary:     Effort: Pulmonary effort is normal.     Breath sounds: Normal breath sounds. No wheezing, rhonchi or rales.   Abdominal:     Palpations: Abdomen is soft.     Tenderness: There is no abdominal tenderness.  Musculoskeletal:     Cervical back: Neck supple.     Right lower leg: No edema.     Left lower leg: No edema.  Skin:    General: Skin is warm and dry.  Neurological:     Mental Status: She is alert.    ED Results / Procedures / Treatments   Labs (all labs ordered are listed, but only abnormal results are displayed) Labs Reviewed  BASIC METABOLIC PANEL - Abnormal; Notable for the following components:      Result Value   Glucose, Bld 106 (*)    BUN 28 (*)    Calcium 10.5 (*)    GFR, Estimated 60 (*)    All other components within normal limits  CBC - Abnormal; Notable for the following components:   WBC 3.5 (*)    All other components within normal limits  D-DIMER, QUANTITATIVE - Abnormal; Notable for the following components:   D-Dimer, Quant 1.61 (*)    All other components within normal limits  TROPONIN I (HIGH SENSITIVITY) - Abnormal; Notable for the following components:   Troponin I (High Sensitivity) 89 (*)    All other components within normal limits  TROPONIN I (HIGH SENSITIVITY) - Abnormal; Notable for the following components:   Troponin I (High Sensitivity) 170 (*)    All other components within normal limits  RESP PANEL BY RT-PCR (FLU A&B, COVID) [id number]  TROPONIN I (HIGH SENSITIVITY)    EKG EKG Interpretation  Date/Time:  Tuesday January 15 2021 13:19:13 EDT Ventricular Rate:  91 PR Interval:  184 QRS Duration: 64 QT Interval:  356 QTC Calculation: 437 R Axis:   10 Text Interpretation: Normal sinus rhythm Nonspecific ST abnormality Abnormal ECG No significant change since last tracing Confirmed by [id number] ([id number]) on 01/15/2021 2:45:56 PM  Radiology DG Chest 2 View  Result Date: 01/15/2021 CLINICAL DATA:  Cardiac palpitations. EXAM: CHEST - 2 VIEW COMPARISON:  August 07, 2011. FINDINGS: The heart size and mediastinal contours are within normal limits.  Both lungs are clear. The visualized skeletal structures are unremarkable. IMPRESSION: No active cardiopulmonary disease. Electronically Signed   By: August 09, 2011 M.D.   On: 01/15/2021 14:01   CT Angio Chest PE W/Cm &/Or Wo Cm  Result Date: 01/15/2021 CLINICAL DATA:  Palpitations beginning this morning without chest pain. Some dizziness. History of hypertension. EXAM: CT ANGIOGRAPHY CHEST WITH CONTRAST TECHNIQUE: Multidetector CT imaging of the chest was performed using the standard protocol during bolus administration of intravenous contrast. Multiplanar CT image reconstructions and MIPs were obtained to evaluate the vascular anatomy. CONTRAST:  39mL OMNIPAQUE IOHEXOL 350 MG/ML SOLN  COMPARISON:  Chest CT, 01/17/2016.  Current chest radiograph. FINDINGS: Cardiovascular: Pulmonary arteries are well opacified. There is no evidence of a pulmonary embolism. Main pulmonary artery measures 4 cm in diameter. Thoracic aorta is normal in caliber. There are atherosclerotic calcifications. Aorta minimally opacified. Heart is mildly enlarged. Small pericardial effusion. Three-vessel coronary artery calcifications. Mediastinum/Nodes: No neck base, mediastinal or hilar masses or enlarged lymph nodes. Trachea and esophagus are unremarkable. Lungs/Pleura: Mild linear opacities most evident in the dependent lower lobes, consistent with atelectasis. 2 small, 4 mm, pulmonary nodules, right upper lobe, image 18 and image 54, series 7, both stable and benign. No new nodules. No evidence of pneumonia or pulmonary edema. No pleural effusion or pneumothorax. Upper Abdomen: No acute abnormality. Musculoskeletal: No fracture or acute finding. No bone lesion. No chest wall masses. Review of the MIP images confirms the above findings. IMPRESSION: 1. No evidence of a pulmonary embolism. 2. No acute findings. Lungs demonstrate mild dependent subsegmental atelectasis, but no evidence of pneumonia or edema. 3. Mild cardiomegaly. Small  pericardial effusion. Dense coronary artery calcifications. 4. Enlarged main pulmonary artery similar to the prior CT suggesting pulmonary artery hypertension. 5. Aortic atherosclerosis. 6. Two small benign stable lung nodules.  No follow-up recommended. Aortic Atherosclerosis (ICD10-I70.0). Electronically Signed   By: Amie Portland M.D.   On: 01/15/2021 16:32    Procedures .Critical Care Performed by: Tanda Rockers, PA-C Authorized by: Tanda Rockers, PA-C   Critical care provider statement:    Critical care time (minutes):  45   Critical care was time spent personally by me on the following activities:  Discussions with consultants, evaluation of patient's response to treatment, examination of patient, ordering and performing treatments and interventions, ordering and review of laboratory studies, ordering and review of radiographic studies, pulse oximetry, re-evaluation of patient's condition, obtaining history from patient or surrogate and review of old charts   Medications Ordered in ED Medications  sodium chloride 0.9 % bolus 500 mL (500 mLs Intravenous New Bag/Given 01/15/21 1558)  iohexol (OMNIPAQUE) 350 MG/ML injection 75 mL (75 mLs Intravenous Contrast Given 01/15/21 1606)    ED Course  I have reviewed the triage vital signs and the nursing notes.  Pertinent labs & imaging results that were available during my care of the patient were reviewed by me and considered in my medical decision making (see chart for details).    MDM Rules/Calculators/A&P                           87 year old female who presents to the ED today with complaint of heart palpitations that lasted approximately 30 minutes to 1 hour without any chest pain.  Does report she was also having some heaviness in her left arm.  On arrival to the ED today patient blood pressure slightly elevated 179/91.  Remainder vitals unremarkable.  EKG with nonspecific ST changes.  Chest x-ray clear.  Troponin of 16.  Remainder of  blood work is unremarkable.  On my exam she is comfortable appearing.  Has no complaints currently.  She has equal grip strength bilaterally.  Respirations are clear and heart is regular rate and rhythm.  We will plan for repeat troponin with likely discharge.  Patient may need to follow-up with cardiology if symptoms recur.  CBC with mild leukopenia 3.5. Hgb stable at 12.4 BMP with slight elevation in calcium 10.5 and BUN 28. No other electrolyte abnormalities.  Troponin 16; will plan for repeat D dimer  elevated 1.61; will proceed with CTA at this time  CTA: IMPRESSION:  1. No evidence of a pulmonary embolism.  2. No acute findings. Lungs demonstrate mild dependent subsegmental  atelectasis, but no evidence of pneumonia or edema.  3. Mild cardiomegaly. Small pericardial effusion. Dense coronary  artery calcifications.  4. Enlarged main pulmonary artery similar to the prior CT suggesting  pulmonary artery hypertension.  5. Aortic atherosclerosis.  6. Two small benign stable lung nodules.  No follow-up recommended.     Aortic Atherosclerosis (ICD10-I70.0).   Does appear that pt had a CT Chest in 2017 with similar pericardial effusion seen.   Repeat troponin 89. Will consult cardiology at this time. Pt will need admission.   5:27 PM Discussed case with cardiology who recommends repeat troponin 2 hours after the second one and if still uptrending would recommend heparin.   5:51 PM Discussed case with Dr. Nelson Chimes who agrees to accept patient for admission.  Repeat troponin 170 16 > 89 > 170. Heparin started.   Final Clinical Impression(s) / ED Diagnoses Final diagnoses:  Elevated troponin  Heart palpitations    Rx / DC Orders ED Discharge Orders     None        Tanda Rockers, PA-C 01/15/21 1913    Ernie Avena, MD 01/15/21 [phone]

## 2021-01-15 NOTE — Progress Notes (Signed)
Patient arrived from [facility]. Arrived on Heparin drip at 8 mL/hr in the IV in the left [facility].  CHG wipes were applied and patient was placed on the portable heart monitor.  On room air.  No complaints of pain.  Resting in bed with call bell in reach.  Admitting has been paged on patient arrival. Will continue to monitor.  Harriet Masson, RN

## 2021-01-15 NOTE — ED Triage Notes (Signed)
Onset this morning, palpations without Chest Pain., she did have some dizziness with episode.

## 2021-01-15 NOTE — H&P (Signed)
History and Physical    Kristie Hicks SHF:[id number] DOB: 1930/12/28 DOA: 01/15/2021  PCP: Melida Quitter, MD Patient coming from: [facility] ED  Chief Complaint: Chest discomfort  HPI: Kristie Hicks is a 87 y.o. female with medical history significant of hypertension, hyperlipidemia presented to the ED complaining of palpitations and atypical chest pain.  Blood pressure elevated with systolic in the 170s on arrival to the ED.  Remainder of vital signs stable.  High-sensitivity troponin 16 >89 >170.  EKG without acute ischemic changes.  D-dimer 1.61. CTA negative for PE.  Does show small pericardial effusion and dense coronary artery calcifications.  Patient was started on IV heparin.  Cardiology recommended admission for routine work-up.  Patient reports she woke up this morning and had a funny feeling in her chest.  She does not remember what she was doing at that time.  She describes it as nonradiating left-sided chest tightness/pressure.  No associated dyspnea, diaphoresis, or nausea.  No palpitations.  She is not sure how severe the pain was.  She thinks this happened around 10 AM and subsided by noon when her daughter came to see her.  She then went to the ED to be evaluated.  Denies prior history of chest pain/discomfort.  Denies history of CAD.  Denies fevers or cough.  No other complaints.  Review of Systems:  All systems reviewed and apart from history of presenting illness, are negative.  Past Medical History:  Diagnosis Date   HL (hearing loss)    Hyperlipidemia    Hypertension    Rapid heart rate     Past Surgical History:  Procedure Laterality Date   CATARACT EXTRACTION       reports that she has never smoked. She has never used smokeless tobacco. She reports that she does not drink alcohol and does not use drugs.  Allergies  Allergen Reactions   Mobic [Meloxicam] Swelling    Feet and ankles swell   Robaxin [Methocarbamol] Swelling    Feet and ankles swell     History reviewed. No pertinent family history.  Prior to Admission medications   Medication Sig Start Date End Date Taking? Authorizing Provider  aspirin EC 81 MG tablet Take 81 mg by mouth every morning.    [provider]  atorvastatin (LIPITOR) 20 MG tablet Take 20 mg by mouth every morning.    [provider]  docusate sodium (COLACE) 100 MG capsule Take 1 capsule (100 mg total) by mouth every 12 (twelve) hours. 01/10/15   Palumbo, April, MD  hydrocortisone (ANUSOL-HC) 25 MG suppository Place 1 suppository (25 mg total) rectally 2 (two) times daily. For 7 days 01/10/15   Nicanor Alcon, April, MD  lisinopril-hydrochlorothiazide (PRINZIDE,ZESTORETIC) 20-12.5 MG per tablet Take 1 tablet by mouth every morning.    [provider]  polyethylene glycol powder (MIRALAX) powder Take 17 g by mouth daily. 01/10/15   Palumbo, April, MD    Physical Exam: Vitals:   01/15/21 1600 01/15/21 1700 01/15/21 2056 01/15/21 2100  BP: (!) 155/73 (!) 129/55 (!) 176/73 (!) 165/74  Pulse: 82 77 99 (!) 101  Resp: 15 16 20 20   Temp:   98.6 F (37 C) 98.6 F (37 C)  TempSrc:   Oral Oral  SpO2: 99% 100% 99% 98%  Weight:   64.4 kg   Height:   5' 4.5" (1.638 m)     Physical Exam Constitutional:      General: She is not in acute distress. HENT:  Head: Normocephalic and atraumatic.  Eyes:     Extraocular Movements: Extraocular movements intact.     Conjunctiva/sclera: Conjunctivae normal.  Cardiovascular:     Rate and Rhythm: Normal rate and regular rhythm.     Pulses: Normal pulses.     Heart sounds: Murmur heard.  Pulmonary:     Effort: Pulmonary effort is normal. No respiratory distress.     Breath sounds: Normal breath sounds. No wheezing or rales.  Abdominal:     General: Bowel sounds are normal. There is no distension.     Palpations: Abdomen is soft.     Tenderness: There is no abdominal tenderness.  Musculoskeletal:     Cervical back: Normal range of motion and  neck supple.     Comments: Bilateral pedal edema  Skin:    General: Skin is warm and dry.  Neurological:     General: No focal deficit present.     Mental Status: She is alert and oriented to person, place, and time.     Labs on Admission: I have personally reviewed following labs and imaging studies  CBC: Recent Labs  Lab 01/15/21 1328  WBC 3.5*  HGB 12.4  HCT 39.0  MCV 93.1  PLT 183   Basic Metabolic Panel: Recent Labs  Lab 01/15/21 1328  NA 140  K 3.5  CL 100  CO2 28  GLUCOSE 106*  BUN 28*  CREATININE 0.92  CALCIUM 10.5*   GFR: Estimated Creatinine Clearance: 37.3 mL/min (by C-G formula based on SCr of 0.92 mg/dL). Liver Function Tests: No results for input(s): AST, ALT, ALKPHOS, BILITOT, PROT, ALBUMIN in the last 168 hours. No results for input(s): LIPASE, AMYLASE in the last 168 hours. No results for input(s): AMMONIA in the last 168 hours. Coagulation Profile: No results for input(s): INR, PROTIME in the last 168 hours. Cardiac Enzymes: No results for input(s): CKTOTAL, CKMB, CKMBINDEX, TROPONINI in the last 168 hours. BNP (last 3 results) No results for input(s): PROBNP in the last 8760 hours. HbA1C: No results for input(s): HGBA1C in the last 72 hours. CBG: No results for input(s): GLUCAP in the last 168 hours. Lipid Profile: No results for input(s): CHOL, HDL, LDLCALC, TRIG, CHOLHDL, LDLDIRECT in the last 72 hours. Thyroid Function Tests: No results for input(s): TSH, T4TOTAL, FREET4, T3FREE, THYROIDAB in the last 72 hours. Anemia Panel: No results for input(s): VITAMINB12, FOLATE, FERRITIN, TIBC, IRON, RETICCTPCT in the last 72 hours. Urine analysis:    Component Value Date/Time   COLORURINE YELLOW 01/29/2011 0728   APPEARANCEUR CLEAR 01/29/2011 0728   LABSPEC 1.008 01/29/2011 0728   PHURINE 6.5 01/29/2011 0728   GLUCOSEU NEGATIVE 01/29/2011 0728   HGBUR TRACE (A) 01/29/2011 0728   BILIRUBINUR NEGATIVE 01/29/2011 0728   KETONESUR NEGATIVE  01/29/2011 0728   PROTEINUR NEGATIVE 01/29/2011 0728   UROBILINOGEN 0.2 01/29/2011 0728   NITRITE NEGATIVE 01/29/2011 0728   LEUKOCYTESUR TRACE (A) 01/29/2011 0728    Radiological Exams on Admission: DG Chest 2 View  Result Date: 01/15/2021 CLINICAL DATA:  Cardiac palpitations. EXAM: CHEST - 2 VIEW COMPARISON:  August 07, 2011. FINDINGS: The heart size and mediastinal contours are within normal limits. Both lungs are clear. The visualized skeletal structures are unremarkable. IMPRESSION: No active cardiopulmonary disease. Electronically Signed   By: Lupita Raider M.D.   On: 01/15/2021 14:01   CT Angio Chest PE W/Cm &/Or Wo Cm  Result Date: 01/15/2021 CLINICAL DATA:  Palpitations beginning this morning without chest pain. Some dizziness. History of  hypertension. EXAM: CT ANGIOGRAPHY CHEST WITH CONTRAST TECHNIQUE: Multidetector CT imaging of the chest was performed using the standard protocol during bolus administration of intravenous contrast. Multiplanar CT image reconstructions and MIPs were obtained to evaluate the vascular anatomy. CONTRAST:  25mL OMNIPAQUE IOHEXOL 350 MG/ML SOLN COMPARISON:  Chest CT, 01/17/2016.  Current chest radiograph. FINDINGS: Cardiovascular: Pulmonary arteries are well opacified. There is no evidence of a pulmonary embolism. Main pulmonary artery measures 4 cm in diameter. Thoracic aorta is normal in caliber. There are atherosclerotic calcifications. Aorta minimally opacified. Heart is mildly enlarged. Small pericardial effusion. Three-vessel coronary artery calcifications. Mediastinum/Nodes: No neck base, mediastinal or hilar masses or enlarged lymph nodes. Trachea and esophagus are unremarkable. Lungs/Pleura: Mild linear opacities most evident in the dependent lower lobes, consistent with atelectasis. 2 small, 4 mm, pulmonary nodules, right upper lobe, image 18 and image 54, series 7, both stable and benign. No new nodules. No evidence of pneumonia or pulmonary edema. No  pleural effusion or pneumothorax. Upper Abdomen: No acute abnormality. Musculoskeletal: No fracture or acute finding. No bone lesion. No chest wall masses. Review of the MIP images confirms the above findings. IMPRESSION: 1. No evidence of a pulmonary embolism. 2. No acute findings. Lungs demonstrate mild dependent subsegmental atelectasis, but no evidence of pneumonia or edema. 3. Mild cardiomegaly. Small pericardial effusion. Dense coronary artery calcifications. 4. Enlarged main pulmonary artery similar to the prior CT suggesting pulmonary artery hypertension. 5. Aortic atherosclerosis. 6. Two small benign stable lung nodules.  No follow-up recommended. Aortic Atherosclerosis (ICD10-I70.0). Electronically Signed   By: Amie Portland M.D.   On: 01/15/2021 16:32    EKG: Independently reviewed.  Sinus rhythm, no significant change since prior tracing.  Assessment/Plan Principal Problem:   NSTEMI (non-ST elevated myocardial infarction) (HCC) Active Problems:   HTN (hypertension)   HLD (hyperlipidemia)   Pulmonary nodule   Leukopenia   NSTEMI Has risk factors for CAD.  High-sensitivity troponin 16 >89 >170.  EKG showing sinus rhythm, no STEMI.  CTA negative for PE.  Does show small pericardial effusion and dense coronary artery calcifications.  Currently chest pain-free and appears comfortable. -Cardiac monitoring -Continue IV heparin -Full dose aspirin -Trend troponin -Echocardiogram -Keep NPO after midnight  -Cardiology consulted  Elevated D-dimer CTA negative for PE. -Bilateral lower extremity Dopplers ordered  Hypertension Blood pressure elevated, systolic currently in the 160s. -IV hydralazine prn -Resume home meds after pharmacy med rec is done.  Hyperlipidemia -Resume statin after pharmacy med rec is done.  Pulmonary nodules CT showing 2 small benign stable lung nodules.  No history of cigarette smoking.   -Radiologist recommending no follow-up.  Mild leukopenia -Repeat  CBC with differential  DVT prophylaxis: Heparin Code Status: Patient wishes to be full code.  Family Communication: No family available at this time.   Disposition Plan: Status is: Inpatient  Remains inpatient appropriate because:Ongoing diagnostic testing needed not appropriate for outpatient work up and Inpatient level of care appropriate due to severity of illness  Dispo: The patient is from: Home              Anticipated d/c is to: Home              Patient currently is not medically stable to d/c.   Difficult to place patient No  Consults called: Cardiology (Dr. Welton Flakes) Level of care: Level of care: Progressive  The medical decision making on this patient was of high complexity and the patient is at high risk for clinical deterioration, therefore  this is a level 3 visit.  John Giovanni MD [facility]  If 7PM-7AM, please contact night-coverage [email or web address]  01/15/2021, 10:53 PM

## 2021-01-15 NOTE — Progress Notes (Addendum)
Patient accepted to [facility] from [facility]  87 year old female with history of HTN, HLD started having atypical chest pain and comes to the hospital.  Initial troponin 19 and then trended up 89.  Cardiology recommended admitting the patient for routine work-up.  If continues to trend up, start heparin drip.  Dr. Jerene Pitch from cardiology was notified by the ER provider.  Admit to obs orders placed. Flow RN notified, as patient will be seen by telehospitalist team soon.  Stephania Fragmin MD

## 2021-01-15 NOTE — Progress Notes (Signed)
ANTICOAGULATION CONSULT NOTE - Initial Consult  Pharmacy Consult for heparin Indication: chest pain/ACS  Allergies  Allergen Reactions   Mobic [Meloxicam] Swelling    Feet and ankles swell   Robaxin [Methocarbamol] Swelling    Feet and ankles swell    Patient Measurements: Height: 5\' 4"  (162.6 cm) Weight: 66.2 kg (146 lb) IBW/kg (Calculated) : 54.7 Heparin Dosing Weight: 66kg  Vital Signs: Temp: 97.9 F (36.6 C) (10/04 1307) BP: 129/55 (10/04 1700) Pulse Rate: 77 (10/04 1700)  Labs: Recent Labs    01/15/21 1328 01/15/21 1554 01/15/21 1734  HGB 12.4  --   --   HCT 39.0  --   --   PLT 183  --   --   CREATININE 0.92  --   --   TROPONINIHS 16 89* 170*    Estimated Creatinine Clearance: 39.6 mL/min (by C-G formula based on SCr of 0.92 mg/dL).   Medical History: Past Medical History:  Diagnosis Date   HL (hearing loss)    Hyperlipidemia    Hypertension    Rapid heart rate       Assessment: 87 yo W admitted with chest palpitations and L arm heaviness with suspected NSTEMI. No anticoagulation prior to admission. Pharmacy consulted for heparin.    H/H, plt stable WNL.   Goal of Therapy:  Heparin level 0.3-0.7 units/ml Monitor platelets by anticoagulation protocol: Yes   Plan:  Give heparin bolus 3000 units x1 then start 800 units/hr  Monitor daily HL, CBC/plt Monitor for signs/symptoms of bleeding   98, PharmD, BCPS, BCCP Clinical Pharmacist  Please check AMION for all [facility] Pharmacy phone numbers After 10:00 PM, call Main Pharmacy [phone]

## 2021-01-16 ENCOUNTER — Encounter (HOSPITAL_COMMUNITY): Payer: Self-pay | Admitting: Internal Medicine

## 2021-01-16 ENCOUNTER — Inpatient Hospital Stay (HOSPITAL_COMMUNITY): Payer: Medicare Other

## 2021-01-16 DIAGNOSIS — R7989 Other specified abnormal findings of blood chemistry: Secondary | ICD-10-CM | POA: Diagnosis not present

## 2021-01-16 DIAGNOSIS — I10 Essential (primary) hypertension: Secondary | ICD-10-CM

## 2021-01-16 DIAGNOSIS — R778 Other specified abnormalities of plasma proteins: Secondary | ICD-10-CM

## 2021-01-16 DIAGNOSIS — I214 Non-ST elevation (NSTEMI) myocardial infarction: Secondary | ICD-10-CM

## 2021-01-16 LAB — ECHOCARDIOGRAM COMPLETE
Area-P 1/2: 2.72 cm2
Calc EF: 55.9 %
Height: 64.5 in
S' Lateral: 3 cm
Single Plane A2C EF: 58.4 %
Single Plane A4C EF: 53.1 %
Weight: 2271.62 oz

## 2021-01-16 LAB — CBC WITH DIFFERENTIAL/PLATELET
Abs Immature Granulocytes: 0.01 10*3/uL (ref 0.00–0.07)
Basophils Absolute: 0 10*3/uL (ref 0.0–0.1)
Basophils Relative: 0 %
Eosinophils Absolute: 0 10*3/uL (ref 0.0–0.5)
Eosinophils Relative: 1 %
HCT: 35.4 % — ABNORMAL LOW (ref 36.0–46.0)
Hemoglobin: 11.5 g/dL — ABNORMAL LOW (ref 12.0–15.0)
Immature Granulocytes: 0 %
Lymphocytes Relative: 18 %
Lymphs Abs: 0.7 10*3/uL (ref 0.7–4.0)
MCH: 30.3 pg (ref 26.0–34.0)
MCHC: 32.5 g/dL (ref 30.0–36.0)
MCV: 93.2 fL (ref 80.0–100.0)
Monocytes Absolute: 0.4 10*3/uL (ref 0.1–1.0)
Monocytes Relative: 10 %
Neutro Abs: 2.6 10*3/uL (ref 1.7–7.7)
Neutrophils Relative %: 71 %
Platelets: 165 10*3/uL (ref 150–400)
RBC: 3.8 MIL/uL — ABNORMAL LOW (ref 3.87–5.11)
RDW: 13.8 % (ref 11.5–15.5)
WBC: 3.7 10*3/uL — ABNORMAL LOW (ref 4.0–10.5)
nRBC: 0 % (ref 0.0–0.2)

## 2021-01-16 LAB — TROPONIN I (HIGH SENSITIVITY)
Troponin I (High Sensitivity): 281 ng/L (ref <18)
Troponin I (High Sensitivity): 360 ng/L (ref <18)
Troponin I (High Sensitivity): 345 ng/L (ref <18)
Troponin I (High Sensitivity): 300 ng/L (ref <18)
Troponin I (High Sensitivity): 277 ng/L (ref <18)

## 2021-01-16 LAB — HEPARIN LEVEL (UNFRACTIONATED)
Heparin Unfractionated: 0.52 IU/mL (ref 0.30–0.70)
Heparin Unfractionated: 0.9 IU/mL — ABNORMAL HIGH (ref 0.30–0.70)

## 2021-01-16 LAB — TSH: TSH: 5.354 u[IU]/mL — ABNORMAL HIGH (ref 0.350–4.500)

## 2021-01-16 MED ORDER — CARVEDILOL 6.25 MG PO TABS
6.2500 mg | ORAL_TABLET | Freq: Two times a day (BID) | ORAL | Status: DC
  Administered 2021-01-16 – 2021-01-17 (×3): 6.25 mg via ORAL
  Filled 2021-01-16 (×3): qty 1

## 2021-01-16 MED ORDER — ENOXAPARIN SODIUM 40 MG/0.4ML IJ SOSY
40.0000 mg | PREFILLED_SYRINGE | INTRAMUSCULAR | Status: DC
  Administered 2021-01-16 – 2021-01-17 (×2): 40 mg via SUBCUTANEOUS
  Filled 2021-01-16 (×2): qty 0.4

## 2021-01-16 NOTE — Progress Notes (Signed)
PROGRESS NOTE    Kristie Hicks  EAV:[id number] DOB: 1930/12/15 DOA: 01/15/2021 PCP: Melida Quitter, MD   Brief Narrative:  Kristie Hicks is a 87 y.o. female with medical history significant of hypertension, hyperlipidemia presented to the ED complaining of palpitations and atypical chest pain.  Blood pressure elevated with systolic in the 170s on arrival to the ED.  Remainder of vital signs stable.  High-sensitivity troponin 16 >89 >170.  EKG without acute ischemic changes.  D-dimer 1.61. CTA negative for PE.  Does show small pericardial effusion and dense coronary artery calcifications.  Patient was started on IV heparin.  Cardiology recommended admission for routine work-up.   Patient reports she woke up this morning and had a funny feeling in her chest.  She does not remember what she was doing at that time.  She describes it as nonradiating left-sided chest tightness/pressure.  No associated dyspnea, diaphoresis, or nausea.  No palpitations.  She is not sure how severe the pain was.  She thinks this happened around 10 AM and subsided by noon when her daughter came to see her.  She then went to the ED to be evaluated.  Denies prior history of chest pain/discomfort.  Denies history of CAD.  Denies fevers or cough.  No other complaints.  Assessment & Plan:   Principal Problem:   NSTEMI (non-ST elevated myocardial infarction) (HCC) Active Problems:   HTN (hypertension)   HLD (hyperlipidemia)   Pulmonary nodule   Leukopenia   Elevated troponin   #1 elevated troponin patient admitted with atypical chest pain with troponin peaking at 360. Appreciate cardiology input.  Patient and family opting for medical management. CT chest negative for PE but showed severe coronary artery calcification. Echo with ejection fraction 75%. Doppler shows no DVT will DC heparin.  #2 essential hypertension poorly controlled on HCTZ at home which has been on hold.  Started carvedilol.  #3  hyperlipidemia checking lipid panel and TSH.   #4 lung nodules with no history of smoking no follow-up recommended by radiology  Estimated body mass index is 23.99 kg/m as calculated from the following:   Height as of this encounter: 5' 4.5" (1.638 m).   Weight as of this encounter: 64.4 kg.  DVT prophylaxis:lovenox Code Status: full Family Communication: dw patient Disposition Plan:  Status is: Inpatient  Remains inpatient appropriate because:IV treatments appropriate due to intensity of illness or inability to take PO  Dispo: The patient is from: Home              Anticipated d/c is to: Home              Patient currently is not medically stable to d/c.   Difficult to place patient No    Consultants:  cards  Procedures: none Antimicrobials: none  Subjective:  She is resting in bed denies any chest discomfort this morning no shortness of breath Objective: Vitals:   01/15/21 2306 01/16/21 0404 01/16/21 0827 01/16/21 1440  BP: (!) 128/91 (!) 118/50 139/63 (!) 141/65  Pulse: 81 67 74 76  Resp: Temp: 98.4 F (36.9 C) 97.9 F (36.6 C) 98.3 F (36.8 C) 98.6 F (37 C)  TempSrc: Oral Oral Oral Oral  SpO2: 99% 100% 99% 98%  Weight:      Height:        Intake/Output Summary (Last 24 hours) at 01/16/2021 1516 Last data filed at 01/16/2021 1504 Gross per 24 hour  Intake 293.49 ml  Output 1450 ml  Net -1156.51 ml   Filed Weights   01/15/21 1311 01/15/21 2056  Weight: 66.2 kg 64.4 kg    Examination:  General exam: Appears calm and comfortable  Respiratory system: Clear to auscultation. Respiratory effort normal. Cardiovascular system: S1 & S2 heard, RRR. No JVD, murmurs, rubs, gallops or clicks. No pedal edema. Gastrointestinal system: Abdomen is nondistended, soft and nontender. No organomegaly or masses felt. Normal bowel sounds heard. Central nervous system: Alert and oriented. No focal neurological deficits. Extremities: Symmetric 5 x 5  power. Skin: No rashes, lesions or ulcers Psychiatry: Judgement and insight appear normal. Mood & affect appropriate.     Data Reviewed: I have personally reviewed following labs and imaging studies  CBC: Recent Labs  Lab 01/15/21 1328 01/16/21 0113  WBC 3.5* 3.7*  NEUTROABS  --  2.6  HGB 12.4 11.5*  HCT 39.0 35.4*  MCV 93.1 93.2  PLT 183 165   Basic Metabolic Panel: Recent Labs  Lab 01/15/21 1328  NA 140  K 3.5  CL 100  CO2 28  GLUCOSE 106*  BUN 28*  CREATININE 0.92  CALCIUM 10.5*   GFR: Estimated Creatinine Clearance: 37.3 mL/min (by C-G formula based on SCr of 0.92 mg/dL). Liver Function Tests: No results for input(s): AST, ALT, ALKPHOS, BILITOT, PROT, ALBUMIN in the last 168 hours. No results for input(s): LIPASE, AMYLASE in the last 168 hours. No results for input(s): AMMONIA in the last 168 hours. Coagulation Profile: No results for input(s): INR, PROTIME in the last 168 hours. Cardiac Enzymes: No results for input(s): CKTOTAL, CKMB, CKMBINDEX, TROPONINI in the last 168 hours. BNP (last 3 results) No results for input(s): PROBNP in the last 8760 hours. HbA1C: No results for input(s): HGBA1C in the last 72 hours. CBG: No results for input(s): GLUCAP in the last 168 hours. Lipid Profile: No results for input(s): CHOL, HDL, LDLCALC, TRIG, CHOLHDL, LDLDIRECT in the last 72 hours. Thyroid Function Tests: No results for input(s): TSH, T4TOTAL, FREET4, T3FREE, THYROIDAB in the last 72 hours. Anemia Panel: No results for input(s): VITAMINB12, FOLATE, FERRITIN, TIBC, IRON, RETICCTPCT in the last 72 hours. Sepsis Labs: No results for input(s): PROCALCITON, LATICACIDVEN in the last 168 hours.  Recent Results (from the past 240 hour(s))  Resp Panel by RT-PCR (Flu A&B, Covid) Nasopharyngeal Swab     Status: None   Collection Time: 01/15/21  5:21 PM   Specimen: Nasopharyngeal Swab; Nasopharyngeal(NP) swabs in vial transport medium  Result Value Ref Range Status    SARS Coronavirus 2 by RT PCR NEGATIVE NEGATIVE Final    Comment: (NOTE) SARS-CoV-2 target nucleic acids are NOT DETECTED.  The SARS-CoV-2 RNA is generally detectable in upper respiratory specimens during the acute phase of infection. The lowest concentration of SARS-CoV-2 viral copies this assay can detect is 138 copies/mL. A negative result does not preclude SARS-Cov-2 infection and should not be used as the sole basis for treatment or other patient management decisions. A negative result may occur with  improper specimen collection/handling, submission of specimen other than nasopharyngeal swab, presence of viral mutation(s) within the areas targeted by this assay, and inadequate number of viral copies(<138 copies/mL). A negative result must be combined with clinical observations, patient history, and epidemiological information. The expected result is Negative.  Fact Sheet for Patients:  BloggerCourse.com  Fact Sheet for Healthcare Providers:  SeriousBroker.it  This test is no t yet approved or cleared by the [place] FDA and  has been authorized for detection and/or  diagnosis of SARS-CoV-2 by FDA under an Emergency Use Authorization (EUA). This EUA will remain  in effect (meaning this test can be used) for the duration of the COVID-19 declaration under Section 564(b)(1) of the Act, 21 U.S.C.section 360bbb-3(b)(1), unless the authorization is terminated  or revoked sooner.       Influenza A by PCR NEGATIVE NEGATIVE Final   Influenza B by PCR NEGATIVE NEGATIVE Final    Comment: (NOTE) The Xpert Xpress SARS-CoV-2/FLU/RSV plus assay is intended as an aid in the diagnosis of influenza from Nasopharyngeal swab specimens and should not be used as a sole basis for treatment. Nasal washings and aspirates are unacceptable for Xpert Xpress SARS-CoV-2/FLU/RSV testing.  Fact Sheet for  Patients: BloggerCourse.com  Fact Sheet for Healthcare Providers: SeriousBroker.it  This test is not yet approved or cleared by the [place] FDA and has been authorized for detection and/or diagnosis of SARS-CoV-2 by FDA under an Emergency Use Authorization (EUA). This EUA will remain in effect (meaning this test can be used) for the duration of the COVID-19 declaration under Section 564(b)(1) of the Act, 21 U.S.C. section 360bbb-3(b)(1), unless the authorization is terminated or revoked.  Performed at [facility], [address] [place], [place] [address]          Radiology Studies: DG Chest 2 View  Result Date: 01/15/2021 CLINICAL DATA:  Cardiac palpitations. EXAM: CHEST - 2 VIEW COMPARISON:  August 07, 2011. FINDINGS: The heart size and mediastinal contours are within normal limits. Both lungs are clear. The visualized skeletal structures are unremarkable. IMPRESSION: No active cardiopulmonary disease. Electronically Signed   By: Lupita Raider M.D.   On: 01/15/2021 14:01   CT Angio Chest PE W/Cm &/Or Wo Cm  Result Date: 01/15/2021 CLINICAL DATA:  Palpitations beginning this morning without chest pain. Some dizziness. History of hypertension. EXAM: CT ANGIOGRAPHY CHEST WITH CONTRAST TECHNIQUE: Multidetector CT imaging of the chest was performed using the standard protocol during bolus administration of intravenous contrast. Multiplanar CT image reconstructions and MIPs were obtained to evaluate the vascular anatomy. CONTRAST:  75mL OMNIPAQUE IOHEXOL 350 MG/ML SOLN COMPARISON:  Chest CT, 01/17/2016.  Current chest radiograph. FINDINGS: Cardiovascular: Pulmonary arteries are well opacified. There is no evidence of a pulmonary embolism. Main pulmonary artery measures 4 cm in diameter. Thoracic aorta is normal in caliber. There are atherosclerotic calcifications. Aorta minimally opacified. Heart is mildly  enlarged. Small pericardial effusion. Three-vessel coronary artery calcifications. Mediastinum/Nodes: No neck base, mediastinal or hilar masses or enlarged lymph nodes. Trachea and esophagus are unremarkable. Lungs/Pleura: Mild linear opacities most evident in the dependent lower lobes, consistent with atelectasis. 2 small, 4 mm, pulmonary nodules, right upper lobe, image 18 and image 54, series 7, both stable and benign. No new nodules. No evidence of pneumonia or pulmonary edema. No pleural effusion or pneumothorax. Upper Abdomen: No acute abnormality. Musculoskeletal: No fracture or acute finding. No bone lesion. No chest wall masses. Review of the MIP images confirms the above findings. IMPRESSION: 1. No evidence of a pulmonary embolism. 2. No acute findings. Lungs demonstrate mild dependent subsegmental atelectasis, but no evidence of pneumonia or edema. 3. Mild cardiomegaly. Small pericardial effusion. Dense coronary artery calcifications. 4. Enlarged main pulmonary artery similar to the prior CT suggesting pulmonary artery hypertension. 5. Aortic atherosclerosis. 6. Two small benign stable lung nodules.  No follow-up recommended. Aortic Atherosclerosis (ICD10-I70.0). Electronically Signed   By: Amie Portland M.D.   On: 01/15/2021 16:32   ECHOCARDIOGRAM COMPLETE  Result Date:  01/16/2021    ECHOCARDIOGRAM REPORT   Patient Name:   JOANNAH GITLIN Date of Exam: 01/16/2021 Medical Rec #:  [id number]              Height:       64.5 in Accession #:    [id number]             Weight:       142.0 lb Date of Birth:  Aug 30, 1930               BSA:          1.700 m Patient Age:    89 years               BP:           139/63 mmHg Patient Gender: F                      HR:           79 bpm. Exam Location:  Inpatient Procedure: 2D Echo, Color Doppler and Cardiac Doppler Indications:    NSTEMI  History:        Patient has no prior history of Echocardiogram examinations.                 Acute MI; Risk  Factors:Hypertension and Dyslipidemia.  Sonographer:    Melissa Morford RDCS (AE, PE) Referring Phys: [id number] VASUNDHRA RATHORE IMPRESSIONS  1. There is a 19 mmHg systolic mid-cavitary gradient in the LV from hyperdynamic LV contraction. Left ventricular ejection fraction, by estimation, is >75%. The left ventricle has hyperdynamic function. The left ventricle has no regional wall motion abnormalities. There is mild left ventricular hypertrophy. Left ventricular diastolic parameters are consistent with Grade I diastolic dysfunction (impaired relaxation). Elevated left ventricular end-diastolic pressure.  2. Right ventricular systolic function is normal. The right ventricular size is normal.  3. Left atrial size was moderately dilated.  4. The mitral valve is abnormal. Trivial mitral valve regurgitation. Mild mitral stenosis. The mean mitral valve gradient is 5.0 mmHg with average heart rate of 75 bpm. Moderate mitral annular calcification.  5. The aortic valve is tricuspid. Aortic valve regurgitation is not visualized. Mild aortic valve sclerosis is present, with no evidence of aortic valve stenosis. Comparison(s): No prior Echocardiogram. FINDINGS  Left Ventricle: There is a 19 mmHg systolic mid-cavitary gradient in the LV from hyperdynamic LV contraction. Left ventricular ejection fraction, by estimation, is >75%. The left ventricle has hyperdynamic function. The left ventricle has no regional wall motion abnormalities. The left ventricular internal cavity size was normal in size. There is mild left ventricular hypertrophy. Left ventricular diastolic parameters are consistent with Grade I diastolic dysfunction (impaired relaxation). Elevated left ventricular end-diastolic pressure. Right Ventricle: The right ventricular size is normal. No increase in right ventricular wall thickness. Right ventricular systolic function is normal. Left Atrium: Left atrial size was moderately dilated. Right Atrium: Right atrial size  was normal in size. Pericardium: There is no evidence of pericardial effusion. Mitral Valve: The mitral valve is abnormal. There is moderate calcification of the posterior mitral valve leaflet(s). Moderate mitral annular calcification. Trivial mitral valve regurgitation. Mild mitral valve stenosis. The mean mitral valve gradient is  5.0 mmHg with average heart rate of 75 bpm. Tricuspid Valve: The tricuspid valve is grossly normal. Tricuspid valve regurgitation is trivial. Aortic Valve: The aortic valve is tricuspid. Aortic valve regurgitation is not visualized. Mild aortic valve sclerosis is present, with no evidence  of aortic valve stenosis. Pulmonic Valve: The pulmonic valve was grossly normal. Pulmonic valve regurgitation is trivial. Aorta: The aortic root and ascending aorta are structurally normal, with no evidence of dilitation. Venous: The inferior vena cava was not well visualized. IAS/Shunts: No atrial level shunt detected by color flow Doppler.  LEFT VENTRICLE PLAX 2D LVIDd:         4.50 cm     Diastology LVIDs:         3.00 cm     LV e' medial:   4.13 cm/s LV PW:         0.80 cm     LV E/e' medial: 31.7 LV IVS:        0.70 cm LVOT diam:     2.00 cm LV SV:         62 LV SV Index:   37 LVOT Area:     3.14 cm  LV Volumes (MOD) LV vol d, MOD A2C: 47.6 ml LV vol d, MOD A4C: 44.6 ml LV vol s, MOD A2C: 19.8 ml LV vol s, MOD A4C: 20.9 ml LV SV MOD A2C:     27.8 ml LV SV MOD A4C:     44.6 ml LV SV MOD BP:      26.0 ml RIGHT VENTRICLE RV S prime:     18.50 cm/s TAPSE (M-mode): 1.9 cm LEFT ATRIUM             Index       RIGHT ATRIUM           Index LA diam:        3.90 cm 2.29 cm/m  RA Area:     16.50 cm LA Vol (A2C):   60.0 ml 35.29 ml/m RA Volume:   38.90 ml  22.88 ml/m LA Vol (A4C):   70.3 ml 41.35 ml/m LA Biplane Vol: 69.4 ml 40.82 ml/m  AORTIC VALVE LVOT Vmax:   117.00 cm/s LVOT Vmean:  82.000 cm/s LVOT VTI:    0.198 m  AORTA Ao Asc diam: 2.80 cm MITRAL VALVE MV Area (PHT): 2.72 cm     SHUNTS MV Mean  grad:  5.0 mmHg     Systemic VTI:  0.20 m MV Decel Time: 279 msec     Systemic Diam: 2.00 cm MV E velocity: 131.00 cm/s MV A velocity: 168.00 cm/s MV E/A ratio:  0.78 Zoila Shutter MD Electronically signed by Zoila Shutter MD Signature Date/Time: 01/16/2021/10:24:45 AM    Final    VAS [place] LOWER EXTREMITY VENOUS (DVT)  Result Date: 01/16/2021  Lower Venous DVT Study Patient Name:  DEZIRAE SERVICE  Date of Exam:   01/16/2021 Medical Rec #: [id number]               Accession #:    [id number] Date of Birth: 16-Oct-1930                Patient Gender: F Patient Age:   38 years Exam Location:  [facility] Procedure:      VAS [place] LOWER EXTREMITY VENOUS (DVT) Referring Phys: Ulyess Blossom RATHORE --------------------------------------------------------------------------------  Indications: D-dimer.  Comparison Study: No prior studies. Performing Technologist: Jean Rosenthal RDMS, RVT  Examination Guidelines: A complete evaluation includes B-mode imaging, spectral Doppler, color Doppler, and power Doppler as needed of all accessible portions of each vessel. Bilateral testing is considered an integral part of a complete examination. Limited examinations for reoccurring indications may be performed as noted. The reflux portion of the exam  is performed with the patient in reverse Trendelenburg.  +---------+---------------+---------+-----------+----------+--------------+ RIGHT    CompressibilityPhasicitySpontaneityPropertiesThrombus Aging +---------+---------------+---------+-----------+----------+--------------+ CFV      Full           Yes      Yes                                 +---------+---------------+---------+-----------+----------+--------------+ SFJ      Full                                                        +---------+---------------+---------+-----------+----------+--------------+ FV Prox  Full                                                         +---------+---------------+---------+-----------+----------+--------------+ FV Mid   Full                                                        +---------+---------------+---------+-----------+----------+--------------+ FV DistalFull                                                        +---------+---------------+---------+-----------+----------+--------------+ PFV      Full                                                        +---------+---------------+---------+-----------+----------+--------------+ POP      Full           Yes      Yes                                 +---------+---------------+---------+-----------+----------+--------------+ PTV      Full                                                        +---------+---------------+---------+-----------+----------+--------------+ PERO     Full                                                        +---------+---------------+---------+-----------+----------+--------------+   +---------+---------------+---------+-----------+----------+--------------+ LEFT     CompressibilityPhasicitySpontaneityPropertiesThrombus Aging +---------+---------------+---------+-----------+----------+--------------+ CFV      Full           Yes      Yes                                 +---------+---------------+---------+-----------+----------+--------------+  SFJ      Full                                                        +---------+---------------+---------+-----------+----------+--------------+ FV Prox  Full                                                        +---------+---------------+---------+-----------+----------+--------------+ FV Mid   Full                                                        +---------+---------------+---------+-----------+----------+--------------+ FV DistalFull                                                         +---------+---------------+---------+-----------+----------+--------------+ PFV      Full                                                        +---------+---------------+---------+-----------+----------+--------------+ POP      Full           Yes      Yes                                 +---------+---------------+---------+-----------+----------+--------------+ PTV      Full                                                        +---------+---------------+---------+-----------+----------+--------------+ PERO     Full                                                        +---------+---------------+---------+-----------+----------+--------------+     Summary: RIGHT: - There is no evidence of deep vein thrombosis in the lower extremity.  - A cystic structure is found in the popliteal fossa measuring 5.5 x 1.7 cm.  LEFT: - There is no evidence of deep vein thrombosis in the lower extremity.  - A cystic structure is found in the popliteal fossa.  *See table(s) above for measurements and observations.    Preliminary         Scheduled Meds:  influenza vaccine adjuvanted  0.5 mL Intramuscular Tomorrow-1000   Continuous Infusions:  heparin 650 Units/hr (01/16/21 1307)     LOS: 1 day  Time spent: 38 min    Alwyn Ren, MD  01/16/2021, 3:16 PM

## 2021-01-16 NOTE — Progress Notes (Signed)
Lower extremity venous bilateral study completed.   Please see CV Proc for preliminary results.   Anuj Summons, RDMS, RVT  

## 2021-01-16 NOTE — Plan of Care (Signed)
  Problem: Activity: Goal: Risk for activity intolerance will decrease Outcome: Progressing   Problem: Nutrition: Goal: Adequate nutrition will be maintained Outcome: Progressing   Problem: Elimination: Goal: Will not experience complications related to bowel motility Outcome: Progressing   

## 2021-01-16 NOTE — Consult Note (Addendum)
Cardiology Consultation:   Patient ID: Kristie Hicks MRN: [id number]; DOB: 1930/11/05  Admit date: 01/15/2021 Date of Consult: 01/16/2021  PCP:  Melida Quitter, MD   [facility] Providers Cardiologist:  new    Patient Profile:   Kristie Hicks is a 87 y.o. female with a hx of hypertension and hyperlipidemia who is being seen 01/16/2021 for the evaluation of NSTEMI at the request of Dr. Jerolyn Center.  History of Present Illness:   [name] is a 87 year old female with past medical history of hypertension and hyperlipidemia.  She denies any prior cardiac history.  Her father suddenly passed away when she is 57-month-old due to unknown illness.  She lives with one of her daughter and has been functionally independent without any recent exertional chest pain or worsening dyspnea.  She was essentially in her usual state of health until 01/15/2021.  She woke up yesterday morning without any issue.  Around 9 AM, while watching TV, she started having a vague discomfort in her chest.  She denies any true chest pain.  She says she feels something was off and that she was not feeling very well.  She also had some dizziness however no shortness of breath.  She says she may have had some palpitation however not completely sure about her symptom.  She called her daughter who sent her to [facility].  On arrival, she was noted to have systolic pressure of 170s.  Serial troponin beginning to trend up from 16-->89-->170-->281--> 360 before trending back down.  By the time she arrived at the med center, her symptom has improved.  Renal function and electrolyte were normal.  Hemoglobin normal.  White blood cell count borderline low at 3.5.  D-dimer elevated at 1.6. CTA of the chest showed no PE, mild dependent segmental atelectasis, no evidence of pneumonia or edema, dense coronary artery calcification.  EKG showed normal sinus rhythm without significant ST-T wave changes lower extremity venous  Doppler was negative for DVT, however does reveal a cyst in the right popliteal fossa measuring 5.5 x 1.7 cm.  Cardiology service has been consulted for chest discomfort.  At the time of interview, her symptom has completely resolved.  Echocardiogram obtained on 01/16/2021 revealed hyperdynamic LV contraction with EF greater than 75%, 19 mm systolic mid cavitary gradient in the LV, grade 1 diastolic dysfunction, moderate calcification on the mitral valve, mild mitral stenosis, trivial MR.   Past Medical History:  Diagnosis Date   HL (hearing loss)    Hyperlipidemia    Hypertension    Rapid heart rate     Past Surgical History:  Procedure Laterality Date   CATARACT EXTRACTION       Home Medications:  Prior to Admission medications   Medication Sig Start Date End Date Taking? Authorizing Provider  aspirin EC 81 MG tablet Take 81 mg by mouth every morning.   Yes [provider]  atorvastatin (LIPITOR) 10 MG tablet Take 10 mg by mouth every evening. 11/04/20  Yes [provider]  cholecalciferol (VITAMIN D3) 25 MCG (1000 UNIT) tablet Take 1,000 Units by mouth daily.   Yes [provider]  hydrochlorothiazide (HYDRODIURIL) 25 MG tablet Take 25 mg by mouth every morning. 11/04/20  Yes [provider]  polyethylene glycol powder (MIRALAX) powder Take 17 g by mouth daily. Patient taking differently: Take 17 g by mouth daily as needed for mild constipation. 01/10/15  Yes Palumbo, April, MD  vitamin B-12 (CYANOCOBALAMIN) 1000 MCG tablet Take 1,000 mcg  by mouth daily.   Yes [provider]    Inpatient Medications: Scheduled Meds:  influenza vaccine adjuvanted  0.5 mL Intramuscular Tomorrow-1000   Continuous Infusions:  heparin 650 Units/hr (01/16/21 1307)   PRN Meds: acetaminophen **OR** acetaminophen, hydrALAZINE  Allergies:    Allergies  Allergen Reactions   Mobic [Meloxicam] Swelling    Feet and ankles swell   Robaxin [Methocarbamol]  Swelling    Feet and ankles swell    Social History:   Social History   Socioeconomic History   Marital status: Widowed    Spouse name: Not on file   Number of children: Not on file   Years of education: Not on file   Highest education level: Not on file  Occupational History   Not on file  Tobacco Use   Smoking status: Never   Smokeless tobacco: Never  Vaping Use   Vaping Use: Never used  Substance and Sexual Activity   Alcohol use: No   Drug use: Never   Sexual activity: Not on file  Other Topics Concern   Not on file  Social History Narrative   History of Smoking cigarettes: Never Smoked   No Alcohol   No recreational drug use   Occupation: Working   Investment banker, 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   All other ROS reviewed and negative.     Physical Exam/Data:   Vitals:   01/15/21 2100 01/15/21 2306 01/16/21 0404 01/16/21 0827  BP: (!) 165/74 (!) 128/91 (!) 118/50 139/63  Pulse: (!) 101 81 67 74  Resp: 20 20 14 16   Temp: 98.6 F (37 C) 98.4 F (36.9 C) 97.9 F (36.6 C) 98.3 F (36.8 C)  TempSrc: Oral Oral Oral Oral  SpO2: 98% 99% 100% 99%  Weight:      Height:        Intake/Output Summary (Last 24 hours) at 01/16/2021 1343 Last data filed at 01/16/2021 1049 Gross per 24 hour  Intake 293.49 ml  Output 1150 ml  Net -856.51 ml   Last 3 Weights 01/15/2021 01/15/2021 01/09/2015  Weight (lbs) 141 lb 15.6 oz 146 lb 172 lb  Weight (kg) 64.4 kg 66.225 kg 78.019 kg     Body mass index is 23.99 kg/m.  General:  Well nourished, well developed, in no acute distress HEENT:  normal Neck: no JVD Vascular: No carotid bruits; Distal pulses 2+ bilaterally Cardiac:  normal S1, S2; RRR; no murmur  Lungs:  clear to auscultation bilaterally, no wheezing, rhonchi or rales  Abd: soft, nontender, no hepatomegaly  Ext: no edema Musculoskeletal:  No deformities, BUE and BLE strength normal and equal Skin: warm and dry  Neuro:  CNs 2-12 intact, no focal abnormalities noted Psych:  Normal affect   EKG:  The EKG was personally reviewed and demonstrates: Normal sinus rhythm, no significant ST-T wave changes Telemetry:  Telemetry was personally reviewed and demonstrates: Sinus rhythm, no significant ventricular ectopy  Relevant CV Studies:  Echo 01/16/2021  1. There is a 19 mmHg systolic mid-cavitary  gradient in the LV from  hyperdynamic LV contraction. Left ventricular ejection fraction, by  estimation, is >75%. The left ventricle has hyperdynamic function. The  left ventricle has no regional wall motion  abnormalities. There is mild left ventricular hypertrophy. Left  ventricular diastolic parameters are consistent with Grade I diastolic  dysfunction (impaired relaxation). Elevated left ventricular end-diastolic  pressure.   2. Right ventricular systolic function is normal. The right ventricular  size is normal.   3. Left atrial size was moderately dilated.   4. The mitral valve is abnormal. Trivial mitral valve regurgitation. Mild  mitral stenosis. The mean mitral valve gradient is 5.0 mmHg with average  heart rate of 75 bpm. Moderate mitral annular calcification.   5. The aortic valve is tricuspid. Aortic valve regurgitation is not  visualized. Mild aortic valve sclerosis is present, with no evidence of  aortic valve stenosis.   Comparison(s): No prior Echocardiogram.  Laboratory Data:  High Sensitivity Troponin:   Recent Labs  Lab 01/15/21 2243 01/16/21 0113 01/16/21 0619 01/16/21 0721 01/16/21 0852  TROPONINIHS 281* 360* 345* 300* 277*      Chemistry Recent Labs  Lab 01/15/21 1328  NA 140  K 3.5  CL 100  CO2 28  GLUCOSE 106*  BUN 28*  CREATININE 0.92  CALCIUM 10.5*  GFRNONAA 60*  ANIONGAP 12    No results for input(s): PROT, ALBUMIN, AST, ALT, ALKPHOS, BILITOT in the last 168 hours. Lipids No results for input(s): CHOL, TRIG, HDL, LABVLDL, LDLCALC, CHOLHDL in the last 168 hours.  Hematology Recent Labs  Lab 01/15/21 1328 01/16/21 0113  WBC 3.5* 3.7*  RBC 4.19 3.80*  HGB 12.4 11.5*  HCT 39.0 35.4*  MCV 93.1 93.2  MCH 29.6 30.3  MCHC 31.8 32.5  RDW 13.7 13.8  PLT 183 165   Thyroid No results for input(s): TSH, FREET4 in the last 168 hours.  BNPNo results for input(s): BNP, PROBNP in the last 168 hours.  DDimer  Recent Labs  Lab 01/15/21 1328  DDIMER 1.61*     Radiology/Studies:  DG Chest 2 View  Result Date: 01/15/2021 CLINICAL DATA:  Cardiac palpitations. EXAM: CHEST - 2 VIEW COMPARISON:  August 07, 2011. FINDINGS: The heart size and mediastinal contours are within normal limits. Both lungs are clear. The visualized skeletal structures are unremarkable. IMPRESSION: No active cardiopulmonary disease. Electronically Signed   By: Lupita Raider M.D.   On: 01/15/2021 14:01   CT Angio Chest PE W/Cm &/Or Wo Cm  Result Date: 01/15/2021 CLINICAL DATA:  Palpitations beginning this morning without chest pain. Some dizziness. History of hypertension. EXAM: CT ANGIOGRAPHY CHEST WITH CONTRAST TECHNIQUE: Multidetector CT imaging of the chest was performed using the standard protocol during bolus administration of intravenous contrast. Multiplanar CT image reconstructions and MIPs were obtained to evaluate the vascular anatomy. CONTRAST:  75mL OMNIPAQUE IOHEXOL 350 MG/ML SOLN COMPARISON:  Chest CT, 01/17/2016.  Current chest radiograph. FINDINGS: Cardiovascular: Pulmonary arteries are well opacified. There is no evidence of a pulmonary embolism. Main pulmonary artery measures 4 cm in diameter. Thoracic aorta is normal  in caliber. There are atherosclerotic calcifications. Aorta minimally opacified. Heart is mildly enlarged. Small pericardial effusion. Three-vessel coronary artery calcifications. Mediastinum/Nodes: No neck base, mediastinal or hilar masses or enlarged lymph nodes. Trachea and esophagus are unremarkable. Lungs/Pleura: Mild linear opacities most evident in the dependent lower lobes, consistent with atelectasis. 2 small, 4 mm, pulmonary nodules, right upper lobe, image 18 and image 54, series 7, both stable and  benign. No new nodules. No evidence of pneumonia or pulmonary edema. No pleural effusion or pneumothorax. Upper Abdomen: No acute abnormality. Musculoskeletal: No fracture or acute finding. No bone lesion. No chest wall masses. Review of the MIP images confirms the above findings. IMPRESSION: 1. No evidence of a pulmonary embolism. 2. No acute findings. Lungs demonstrate mild dependent subsegmental atelectasis, but no evidence of pneumonia or edema. 3. Mild cardiomegaly. Small pericardial effusion. Dense coronary artery calcifications. 4. Enlarged main pulmonary artery similar to the prior CT suggesting pulmonary artery hypertension. 5. Aortic atherosclerosis. 6. Two small benign stable lung nodules.  No follow-up recommended. Aortic Atherosclerosis (ICD10-I70.0). Electronically Signed   By: Amie Portland M.D.   On: 01/15/2021 16:32   ECHOCARDIOGRAM COMPLETE  Result Date: 01/16/2021    ECHOCARDIOGRAM REPORT   Patient Name:   ATLEIGH GRUEN Date of Exam: 01/16/2021 Medical Rec #:  [id number]              Height:       64.5 in Accession #:    [id number]             Weight:       142.0 lb Date of Birth:  April 24, 1930               BSA:          1.700 m Patient Age:    89 years               BP:           139/63 mmHg Patient Gender: F                      HR:           79 bpm. Exam Location:  Inpatient Procedure: 2D Echo, Color Doppler and Cardiac Doppler Indications:    NSTEMI  History:        Patient has  no prior history of Echocardiogram examinations.                 Acute MI; Risk Factors:Hypertension and Dyslipidemia.  Sonographer:    Melissa Morford RDCS (AE, PE) Referring Phys: [id number] VASUNDHRA RATHORE IMPRESSIONS  1. There is a 19 mmHg systolic mid-cavitary gradient in the LV from hyperdynamic LV contraction. Left ventricular ejection fraction, by estimation, is >75%. The left ventricle has hyperdynamic function. The left ventricle has no regional wall motion abnormalities. There is mild left ventricular hypertrophy. Left ventricular diastolic parameters are consistent with Grade I diastolic dysfunction (impaired relaxation). Elevated left ventricular end-diastolic pressure.  2. Right ventricular systolic function is normal. The right ventricular size is normal.  3. Left atrial size was moderately dilated.  4. The mitral valve is abnormal. Trivial mitral valve regurgitation. Mild mitral stenosis. The mean mitral valve gradient is 5.0 mmHg with average heart rate of 75 bpm. Moderate mitral annular calcification.  5. The aortic valve is tricuspid. Aortic valve regurgitation is not visualized. Mild aortic valve sclerosis is present, with no evidence of aortic valve stenosis. Comparison(s): No prior Echocardiogram. FINDINGS  Left Ventricle: There is a 19 mmHg systolic mid-cavitary gradient in the LV from hyperdynamic LV contraction. Left ventricular ejection fraction, by estimation, is >75%. The left ventricle has hyperdynamic function. The left ventricle has no regional wall motion abnormalities. The left ventricular internal cavity size was normal in size. There is mild left ventricular hypertrophy. Left ventricular diastolic parameters are consistent with Grade I diastolic dysfunction (impaired relaxation). Elevated left  ventricular end-diastolic pressure. Right Ventricle: The right ventricular size is normal. No increase in right ventricular wall thickness. Right ventricular systolic function is normal. Left  Atrium: Left atrial size was moderately dilated. Right Atrium: Right atrial size was normal in size. Pericardium: There is no evidence of pericardial effusion. Mitral Valve: The mitral valve is abnormal. There is moderate calcification of the posterior mitral valve leaflet(s). Moderate mitral annular calcification. Trivial mitral valve regurgitation. Mild mitral valve stenosis. The mean mitral valve gradient is  5.0 mmHg with average heart rate of 75 bpm. Tricuspid Valve: The tricuspid valve is grossly normal. Tricuspid valve regurgitation is trivial. Aortic Valve: The aortic valve is tricuspid. Aortic valve regurgitation is not visualized. Mild aortic valve sclerosis is present, with no evidence of aortic valve stenosis. Pulmonic Valve: The pulmonic valve was grossly normal. Pulmonic valve regurgitation is trivial. Aorta: The aortic root and ascending aorta are structurally normal, with no evidence of dilitation. Venous: The inferior vena cava was not well visualized. IAS/Shunts: No atrial level shunt detected by color flow Doppler.  LEFT VENTRICLE PLAX 2D LVIDd:         4.50 cm     Diastology LVIDs:         3.00 cm     LV e' medial:   4.13 cm/s LV PW:         0.80 cm     LV E/e' medial: 31.7 LV IVS:        0.70 cm LVOT diam:     2.00 cm LV SV:         62 LV SV Index:   37 LVOT Area:     3.14 cm  LV Volumes (MOD) LV vol d, MOD A2C: 47.6 ml LV vol d, MOD A4C: 44.6 ml LV vol s, MOD A2C: 19.8 ml LV vol s, MOD A4C: 20.9 ml LV SV MOD A2C:     27.8 ml LV SV MOD A4C:     44.6 ml LV SV MOD BP:      26.0 ml RIGHT VENTRICLE RV S prime:     18.50 cm/s TAPSE (M-mode): 1.9 cm LEFT ATRIUM             Index       RIGHT ATRIUM           Index LA diam:        3.90 cm 2.29 cm/m  RA Area:     16.50 cm LA Vol (A2C):   60.0 ml 35.29 ml/m RA Volume:   38.90 ml  22.88 ml/m LA Vol (A4C):   70.3 ml 41.35 ml/m LA Biplane Vol: 69.4 ml 40.82 ml/m  AORTIC VALVE LVOT Vmax:   117.00 cm/s LVOT Vmean:  82.000 cm/s LVOT VTI:    0.198 m   AORTA Ao Asc diam: 2.80 cm MITRAL VALVE MV Area (PHT): 2.72 cm     SHUNTS MV Mean grad:  5.0 mmHg     Systemic VTI:  0.20 m MV Decel Time: 279 msec     Systemic Diam: 2.00 cm MV E velocity: 131.00 cm/s MV A velocity: 168.00 cm/s MV E/A ratio:  0.78 Zoila Shutter MD Electronically signed by Zoila Shutter MD Signature Date/Time: 01/16/2021/10:24:45 AM    Final    VAS [place] LOWER EXTREMITY VENOUS (DVT)  Result Date: 01/16/2021  Lower Venous DVT Study Patient Name:  NOEL HENANDEZ  Date of Exam:   01/16/2021 Medical Rec #: [id number]  Accession #:    [id number] Date of Birth: December 06, 1930                Patient Gender: F Patient Age:   89 years Exam Location:  [facility] Procedure:      VAS [place] LOWER EXTREMITY VENOUS (DVT) Referring Phys: Ulyess Blossom RATHORE --------------------------------------------------------------------------------  Indications: D-dimer.  Comparison Study: No prior studies. Performing Technologist: Jean Rosenthal RDMS, RVT  Examination Guidelines: A complete evaluation includes B-mode imaging, spectral Doppler, color Doppler, and power Doppler as needed of all accessible portions of each vessel. Bilateral testing is considered an integral part of a complete examination. Limited examinations for reoccurring indications may be performed as noted. The reflux portion of the exam is performed with the patient in reverse Trendelenburg.  +---------+---------------+---------+-----------+----------+--------------+ RIGHT    CompressibilityPhasicitySpontaneityPropertiesThrombus Aging +---------+---------------+---------+-----------+----------+--------------+ CFV      Full           Yes      Yes                                 +---------+---------------+---------+-----------+----------+--------------+ SFJ      Full                                                        +---------+---------------+---------+-----------+----------+--------------+ FV Prox  Full                                                         +---------+---------------+---------+-----------+----------+--------------+ FV Mid   Full                                                        +---------+---------------+---------+-----------+----------+--------------+ FV DistalFull                                                        +---------+---------------+---------+-----------+----------+--------------+ PFV      Full                                                        +---------+---------------+---------+-----------+----------+--------------+ POP      Full           Yes      Yes                                 +---------+---------------+---------+-----------+----------+--------------+ PTV      Full                                                        +---------+---------------+---------+-----------+----------+--------------+  PERO     Full                                                        +---------+---------------+---------+-----------+----------+--------------+   +---------+---------------+---------+-----------+----------+--------------+ LEFT     CompressibilityPhasicitySpontaneityPropertiesThrombus Aging +---------+---------------+---------+-----------+----------+--------------+ CFV      Full           Yes      Yes                                 +---------+---------------+---------+-----------+----------+--------------+ SFJ      Full                                                        +---------+---------------+---------+-----------+----------+--------------+ FV Prox  Full                                                        +---------+---------------+---------+-----------+----------+--------------+ FV Mid   Full                                                        +---------+---------------+---------+-----------+----------+--------------+ FV DistalFull                                                         +---------+---------------+---------+-----------+----------+--------------+ PFV      Full                                                        +---------+---------------+---------+-----------+----------+--------------+ POP      Full           Yes      Yes                                 +---------+---------------+---------+-----------+----------+--------------+ PTV      Full                                                        +---------+---------------+---------+-----------+----------+--------------+ PERO     Full                                                        +---------+---------------+---------+-----------+----------+--------------+  Summary: RIGHT: - There is no evidence of deep vein thrombosis in the lower extremity.  - A cystic structure is found in the popliteal fossa measuring 5.5 x 1.7 cm.  LEFT: - There is no evidence of deep vein thrombosis in the lower extremity.  - A cystic structure is found in the popliteal fossa.  *See table(s) above for measurements and observations.    Preliminary      Assessment and Plan:   Elevated troponin: Serial troponin peaked at a 360 before trending back down.  Patient had vague chest discomfort yesterday morning around 9 AM associated with dizziness.  She says there may be some palpitations while however she is not sure.  Echocardiogram showed hyperdynamic LV EF greater than 75% with mitral valve calcification.  CTA of the chest is negative for PE however shows severe coronary calcification.  -EKG shows no obvious ischemic changes.  Differential diagnosis for elevated troponin include ACS versus arrhythmia versus uncontrolled high blood pressure  -discussed with Dr. [place] who also spoke with the patient regarding different options, patient opted for medical therapy. Obtain TSH and FLP. Possible discharge tomorrow.   Elevated D-dimer: CTA of the chest negative for PE   Hypertension: Blood pressure elevated on  arrival.  -On 25 mg daily of hydrochlorothiazide at home  -Hydrochlorothiazide on hold since admission.  Given her advanced age and hyperdynamic LV function, would like to avoid diuretic.  Consider place the patient on carvedilol instead  Hyperlipidemia: On Lipitor 10 mg daily.   Risk Assessment/Risk Scores:     TIMI Risk Score for Unstable Angina or Non-ST Elevation MI:   The patient's TIMI risk score is 4, which indicates a 20% risk of all cause mortality, new or recurrent myocardial infarction or need for urgent revascularization in the next 14 days.          For questions or updates, please contact [facility] Please consult [email or web address] for contact info under    Ramond Dial, [place]  01/16/2021 1:43 PM

## 2021-01-16 NOTE — Progress Notes (Signed)
ANTICOAGULATION CONSULT NOTE   Pharmacy Consult for heparin Indication: chest pain/ACS  Allergies  Allergen Reactions   Mobic [Meloxicam] Swelling    Feet and ankles swell   Robaxin [Methocarbamol] Swelling    Feet and ankles swell    Patient Measurements: Height: 5' 4.5" (163.8 cm) Weight: 64.4 kg (141 lb 15.6 oz) IBW/kg (Calculated) : 55.85 Heparin Dosing Weight: 66kg  Vital Signs: Temp: 98.3 F (36.8 C) (10/05 0827) Temp Source: Oral (10/05 0827) BP: 139/63 (10/05 0827) Pulse Rate: 74 (10/05 0827)  Labs: Recent Labs    01/15/21 1328 01/15/21 1554 01/16/21 0113 01/16/21 0619 01/16/21 0721 01/16/21 0852 01/16/21 1103  HGB 12.4  --  11.5*  --   --   --   --   HCT 39.0  --  35.4*  --   --   --   --   PLT 183  --  165  --   --   --   --   HEPARINUNFRC  --   --  0.52  --   --   --  0.90*  CREATININE 0.92  --   --   --   --   --   --   TROPONINIHS 16   < > 360* 345* 300* 277*  --    < > = values in this interval not displayed.     Estimated Creatinine Clearance: 37.3 mL/min (by C-G formula based on SCr of 0.92 mg/dL).   Medical History: Past Medical History:  Diagnosis Date   HL (hearing loss)    Hyperlipidemia    Hypertension    Rapid heart rate       Assessment: 87 yo W admitted with chest palpitations and L arm heaviness with suspected NSTEMI. No anticoagulation prior to admission. Pharmacy consulted for heparin.    H/H, plt stable WNL.   Heparin level this afternoon slightly above goal range at 0.9.  Per RN IV site where heparin is running is bleeding > relocating IV.  No other overt bleeding or complications noted.  Goal of Therapy:  Heparin level 0.3-0.7 units/ml Monitor platelets by anticoagulation protocol: Yes   Plan:  Decrease IV heparin to 650 units/hr. Repeat heparin level in 8 hrs. Daily heparin level and CBC. F/u plans for cards workup.  Reece Leader, Colon Flattery, BCCP Clinical Pharmacist  01/16/2021 12:54 PM   [facility] pharmacy  phone numbers are listed on [email or web address]

## 2021-01-16 NOTE — Progress Notes (Signed)
2D echocardiogram complete 

## 2021-01-16 NOTE — Progress Notes (Signed)
Chaplain received consult to provide education regarding Advance Directives. Pt was not in the room during visit. Her daughter reported she was having an ultrasound. She stated that someone last night mentioned that an Advance Directive would be a good idea. Chaplain provided brief education on the purpose of advance directives: documents, which provide instruction on an individual's wishes for medical treatment, created in advance of a time when an individual is unable to make medical decisions for themselves and the two types offered-living will and health care power of attorney. Mrs. Vega husband is deceased and she lives with her other adult daughter. Chaplain briefly explained the times in which advance directives could be notarized in the hospital, typically between 1:00 and 3:30 Monday-Thursday and that documents could also be completed outpatient, but need to be signed and notarized in the presence of two witnesses.   Chaplain team will try to follow up as acuity allows, but pt's daughter also informed to notify RN to request a consult if pt desires more education or to complete the documents. Daughter reports her sister, who lives with Mrs Shinn, will come to the hospital after work and they will probably review then.  Please page as further needs arise.  Maryanna Shape. Carley Hammed, M.Div. [facility] Chaplain Pager [phone] Office [phone]      01/16/21 0930  Clinical Encounter Type  Visited With Patient not available;Family  Visit Type Initial;Spiritual support;Social support  Spiritual Encounters  Spiritual Needs Other (Comment)  Advance Directives (For Healthcare)  Does Patient Have a Medical Advance Directive? No  Would patient like information on creating a medical advance directive? Yes (Inpatient - patient defers creating a medical advance directive at this time - Information given)

## 2021-01-17 LAB — CBC
HCT: 34.1 % — ABNORMAL LOW (ref 36.0–46.0)
Hemoglobin: 11.3 g/dL — ABNORMAL LOW (ref 12.0–15.0)
MCH: 30.5 pg (ref 26.0–34.0)
MCHC: 33.1 g/dL (ref 30.0–36.0)
MCV: 91.9 fL (ref 80.0–100.0)
Platelets: 178 10*3/uL (ref 150–400)
RBC: 3.71 MIL/uL — ABNORMAL LOW (ref 3.87–5.11)
RDW: 14 % (ref 11.5–15.5)
WBC: 3.3 10*3/uL — ABNORMAL LOW (ref 4.0–10.5)
nRBC: 0 % (ref 0.0–0.2)

## 2021-01-17 LAB — BASIC METABOLIC PANEL
Anion gap: 8 (ref 5–15)
BUN: 17 mg/dL (ref 8–23)
CO2: 29 mmol/L (ref 22–32)
Calcium: 9.5 mg/dL (ref 8.9–10.3)
Chloride: 102 mmol/L (ref 98–111)
Creatinine, Ser: 0.99 mg/dL (ref 0.44–1.00)
GFR, Estimated: 55 mL/min — ABNORMAL LOW (ref >60)
Glucose, Bld: 97 mg/dL (ref 70–99)
Potassium: 3.7 mmol/L (ref 3.5–5.1)
Sodium: 139 mmol/L (ref 135–145)

## 2021-01-17 LAB — LIPID PANEL
Cholesterol: 188 mg/dL (ref 0–200)
HDL: 65 mg/dL (ref >40)
LDL Cholesterol: 113 mg/dL — ABNORMAL HIGH (ref 0–99)
Total CHOL/HDL Ratio: 2.9 RATIO
Triglycerides: 50 mg/dL (ref <150)
VLDL: 10 mg/dL (ref 0–40)

## 2021-01-17 MED ORDER — ATORVASTATIN CALCIUM 40 MG PO TABS
40.0000 mg | ORAL_TABLET | Freq: Every day | ORAL | Status: DC
  Administered 2021-01-17: 40 mg via ORAL
  Filled 2021-01-17: qty 1

## 2021-01-17 MED ORDER — ATORVASTATIN CALCIUM 40 MG PO TABS: 40.0000 mg | ORAL_TABLET | Freq: Every day | ORAL | 2 refills | Status: DC

## 2021-01-17 MED ORDER — ASPIRIN EC 81 MG PO TBEC
81.0000 mg | DELAYED_RELEASE_TABLET | Freq: Every day | ORAL | Status: DC
  Administered 2021-01-17: 81 mg via ORAL
  Filled 2021-01-17: qty 1

## 2021-01-17 MED ORDER — CARVEDILOL 6.25 MG PO TABS: 6.2500 mg | ORAL_TABLET | Freq: Two times a day (BID) | ORAL | 2 refills | Status: DC

## 2021-01-17 NOTE — Progress Notes (Signed)
Progress Note  Patient Name: Kristie Hicks Date of Encounter: 01/17/2021  [facility] Cardiologist: None   Subjective   Feeling well this morning.   Inpatient Medications    Scheduled Meds:  aspirin EC  81 mg Oral Daily   atorvastatin  40 mg Oral Daily   carvedilol  6.25 mg Oral BID WC   enoxaparin (LOVENOX) injection  40 mg Subcutaneous Q24H   influenza vaccine adjuvanted  0.5 mL Intramuscular Tomorrow-1000   Continuous Infusions:  PRN Meds: acetaminophen **OR** acetaminophen, hydrALAZINE   Vital Signs    Vitals:   01/16/21 1941 01/16/21 2313 01/17/21 0354 01/17/21 0823  BP: (!) 93/51 106/66 (!) 121/57 (!) 118/54  Pulse: 64 68 67 70  Resp: 18 19 20 16   Temp: 98.3 F (36.8 C) 98.6 F (37 C) 98.5 F (36.9 C) 98.6 F (37 C)  TempSrc: Oral Oral Oral Oral  SpO2: 98% 96% 97% 99%  Weight:      Height:        Intake/Output Summary (Last 24 hours) at 01/17/2021 1030 Last data filed at 01/16/2021 1950 Gross per 24 hour  Intake 201.24 ml  Output 700 ml  Net -498.76 ml   Last 3 Weights 01/15/2021 01/15/2021 01/09/2015  Weight (lbs) 141 lb 15.6 oz 146 lb 172 lb  Weight (kg) 64.4 kg 66.225 kg 78.019 kg      Telemetry    SR - Personally Reviewed  ECG    No new tracing  Physical Exam   GEN: No acute distress.   Neck: No JVD Cardiac: RRR, no murmurs, rubs, or gallops.  Respiratory: Clear to auscultation bilaterally. GI: Soft, nontender, non-distended  MS: No edema; No deformity. Neuro:  Nonfocal  Psych: Normal affect   Labs    High Sensitivity Troponin:   Recent Labs  Lab 01/15/21 2243 01/16/21 0113 01/16/21 0619 01/16/21 0721 01/16/21 0852  TROPONINIHS 281* 360* 345* 300* 277*     Chemistry Recent Labs  Lab 01/15/21 1328 01/17/21 0121  NA 140 139  K 3.5 3.7  CL 100 102  CO2 28 29  GLUCOSE 106* 97  BUN 28* 17  CREATININE 0.92 0.99  CALCIUM 10.5* 9.5  GFRNONAA 60* 55*  ANIONGAP 12 8    Lipids  Recent Labs  Lab  01/17/21 0121  CHOL 188  TRIG 50  HDL 65  LDLCALC 113*  CHOLHDL 2.9    Hematology Recent Labs  Lab 01/15/21 1328 01/16/21 0113 01/17/21 0121  WBC 3.5* 3.7* 3.3*  RBC 4.19 3.80* 3.71*  HGB 12.4 11.5* 11.3*  HCT 39.0 35.4* 34.1*  MCV 93.1 93.2 91.9  MCH 29.6 30.3 30.5  MCHC 31.8 32.5 33.1  RDW 13.7 13.8 14.0  PLT 183 165 178   Thyroid  Recent Labs  Lab 01/16/21 0619  TSH 5.354*    BNPNo results for input(s): BNP, PROBNP in the last 168 hours.  DDimer  Recent Labs  Lab 01/15/21 1328  DDIMER 1.61*     Radiology    DG Chest 2 View  Result Date: 01/15/2021 CLINICAL DATA:  Cardiac palpitations. EXAM: CHEST - 2 VIEW COMPARISON:  August 07, 2011. FINDINGS: The heart size and mediastinal contours are within normal limits. Both lungs are clear. The visualized skeletal structures are unremarkable. IMPRESSION: No active cardiopulmonary disease. Electronically Signed   By: August 09, 2011 M.D.   On: 01/15/2021 14:01   CT Angio Chest PE W/Cm &/Or Wo Cm  Result Date: 01/15/2021 CLINICAL DATA:  Palpitations beginning  this morning without chest pain. Some dizziness. History of hypertension. EXAM: CT ANGIOGRAPHY CHEST WITH CONTRAST TECHNIQUE: Multidetector CT imaging of the chest was performed using the standard protocol during bolus administration of intravenous contrast. Multiplanar CT image reconstructions and MIPs were obtained to evaluate the vascular anatomy. CONTRAST:  75mL OMNIPAQUE IOHEXOL 350 MG/ML SOLN COMPARISON:  Chest CT, 01/17/2016.  Current chest radiograph. FINDINGS: Cardiovascular: Pulmonary arteries are well opacified. There is no evidence of a pulmonary embolism. Main pulmonary artery measures 4 cm in diameter. Thoracic aorta is normal in caliber. There are atherosclerotic calcifications. Aorta minimally opacified. Heart is mildly enlarged. Small pericardial effusion. Three-vessel coronary artery calcifications. Mediastinum/Nodes: No neck base, mediastinal or hilar  masses or enlarged lymph nodes. Trachea and esophagus are unremarkable. Lungs/Pleura: Mild linear opacities most evident in the dependent lower lobes, consistent with atelectasis. 2 small, 4 mm, pulmonary nodules, right upper lobe, image 18 and image 54, series 7, both stable and benign. No new nodules. No evidence of pneumonia or pulmonary edema. No pleural effusion or pneumothorax. Upper Abdomen: No acute abnormality. Musculoskeletal: No fracture or acute finding. No bone lesion. No chest wall masses. Review of the MIP images confirms the above findings. IMPRESSION: 1. No evidence of a pulmonary embolism. 2. No acute findings. Lungs demonstrate mild dependent subsegmental atelectasis, but no evidence of pneumonia or edema. 3. Mild cardiomegaly. Small pericardial effusion. Dense coronary artery calcifications. 4. Enlarged main pulmonary artery similar to the prior CT suggesting pulmonary artery hypertension. 5. Aortic atherosclerosis. 6. Two small benign stable lung nodules.  No follow-up recommended. Aortic Atherosclerosis (ICD10-I70.0). Electronically Signed   By: Amie Portland M.D.   On: 01/15/2021 16:32   ECHOCARDIOGRAM COMPLETE  Result Date: 01/16/2021    ECHOCARDIOGRAM REPORT   Patient Name:   Kristie Hicks Date of Exam: 01/16/2021 Medical Rec #:  [id number]              Height:       64.5 in Accession #:    [id number]             Weight:       142.0 lb Date of Birth:  October 09, 1930               BSA:          1.700 m Patient Age:    87 years               BP:           139/63 mmHg Patient Gender: F                      HR:           79 bpm. Exam Location:  Inpatient Procedure: 2D Echo, Color Doppler and Cardiac Doppler Indications:    NSTEMI  History:        Patient has no prior history of Echocardiogram examinations.                 Acute MI; Risk Factors:Hypertension and Dyslipidemia.  Sonographer:    Melissa Morford RDCS (AE, PE) Referring Phys: [id number] VASUNDHRA RATHORE IMPRESSIONS  1. There is a  19 mmHg systolic mid-cavitary gradient in the LV from hyperdynamic LV contraction. Left ventricular ejection fraction, by estimation, is >75%. The left ventricle has hyperdynamic function. The left ventricle has no regional wall motion abnormalities. There is mild left ventricular hypertrophy. Left ventricular diastolic parameters are consistent with Grade I diastolic dysfunction (impaired relaxation).  Elevated left ventricular end-diastolic pressure.  2. Right ventricular systolic function is normal. The right ventricular size is normal.  3. Left atrial size was moderately dilated.  4. The mitral valve is abnormal. Trivial mitral valve regurgitation. Mild mitral stenosis. The mean mitral valve gradient is 5.0 mmHg with average heart rate of 75 bpm. Moderate mitral annular calcification.  5. The aortic valve is tricuspid. Aortic valve regurgitation is not visualized. Mild aortic valve sclerosis is present, with no evidence of aortic valve stenosis. Comparison(s): No prior Echocardiogram. FINDINGS  Left Ventricle: There is a 19 mmHg systolic mid-cavitary gradient in the LV from hyperdynamic LV contraction. Left ventricular ejection fraction, by estimation, is >75%. The left ventricle has hyperdynamic function. The left ventricle has no regional wall motion abnormalities. The left ventricular internal cavity size was normal in size. There is mild left ventricular hypertrophy. Left ventricular diastolic parameters are consistent with Grade I diastolic dysfunction (impaired relaxation). Elevated left ventricular end-diastolic pressure. Right Ventricle: The right ventricular size is normal. No increase in right ventricular wall thickness. Right ventricular systolic function is normal. Left Atrium: Left atrial size was moderately dilated. Right Atrium: Right atrial size was normal in size. Pericardium: There is no evidence of pericardial effusion. Mitral Valve: The mitral valve is abnormal. There is moderate calcification  of the posterior mitral valve leaflet(s). Moderate mitral annular calcification. Trivial mitral valve regurgitation. Mild mitral valve stenosis. The mean mitral valve gradient is  5.0 mmHg with average heart rate of 75 bpm. Tricuspid Valve: The tricuspid valve is grossly normal. Tricuspid valve regurgitation is trivial. Aortic Valve: The aortic valve is tricuspid. Aortic valve regurgitation is not visualized. Mild aortic valve sclerosis is present, with no evidence of aortic valve stenosis. Pulmonic Valve: The pulmonic valve was grossly normal. Pulmonic valve regurgitation is trivial. Aorta: The aortic root and ascending aorta are structurally normal, with no evidence of dilitation. Venous: The inferior vena cava was not well visualized. IAS/Shunts: No atrial level shunt detected by color flow Doppler.  LEFT VENTRICLE PLAX 2D LVIDd:         4.50 cm     Diastology LVIDs:         3.00 cm     LV e' medial:   4.13 cm/s LV PW:         0.80 cm     LV E/e' medial: 31.7 LV IVS:        0.70 cm LVOT diam:     2.00 cm LV SV:         62 LV SV Index:   37 LVOT Area:     3.14 cm  LV Volumes (MOD) LV vol d, MOD A2C: 47.6 ml LV vol d, MOD A4C: 44.6 ml LV vol s, MOD A2C: 19.8 ml LV vol s, MOD A4C: 20.9 ml LV SV MOD A2C:     27.8 ml LV SV MOD A4C:     44.6 ml LV SV MOD BP:      26.0 ml RIGHT VENTRICLE RV S prime:     18.50 cm/s TAPSE (M-mode): 1.9 cm LEFT ATRIUM             Index       RIGHT ATRIUM           Index LA diam:        3.90 cm 2.29 cm/m  RA Area:     16.50 cm LA Vol (A2C):   60.0 ml 35.29 ml/m RA Volume:   38.90 ml  22.88 ml/m LA Vol (A4C):   70.3 ml 41.35 ml/m LA Biplane Vol: 69.4 ml 40.82 ml/m  AORTIC VALVE LVOT Vmax:   117.00 cm/s LVOT Vmean:  82.000 cm/s LVOT VTI:    0.198 m  AORTA Ao Asc diam: 2.80 cm MITRAL VALVE MV Area (PHT): 2.72 cm     SHUNTS MV Mean grad:  5.0 mmHg     Systemic VTI:  0.20 m MV Decel Time: 279 msec     Systemic Diam: 2.00 cm MV E velocity: 131.00 cm/s MV A velocity: 168.00 cm/s MV E/A  ratio:  0.78 Zoila Shutter MD Electronically signed by Zoila Shutter MD Signature Date/Time: 01/16/2021/10:24:45 AM    Final    VAS [place] LOWER EXTREMITY VENOUS (DVT)  Result Date: 01/16/2021  Lower Venous DVT Study Patient Name:  MAHI ZABRISKIE  Date of Exam:   01/16/2021 Medical Rec #: [id number]               Accession #:    [id number] Date of Birth: 01-10-1931                Patient Gender: F Patient Age:   59 years Exam Location:  [facility] Procedure:      VAS [place] LOWER EXTREMITY VENOUS (DVT) Referring Phys: Ulyess Blossom RATHORE --------------------------------------------------------------------------------  Indications: D-dimer.  Comparison Study: No prior studies. Performing Technologist: Jean Rosenthal RDMS, RVT  Examination Guidelines: A complete evaluation includes B-mode imaging, spectral Doppler, color Doppler, and power Doppler as needed of all accessible portions of each vessel. Bilateral testing is considered an integral part of a complete examination. Limited examinations for reoccurring indications may be performed as noted. The reflux portion of the exam is performed with the patient in reverse Trendelenburg.  +---------+---------------+---------+-----------+----------+--------------+ RIGHT    CompressibilityPhasicitySpontaneityPropertiesThrombus Aging +---------+---------------+---------+-----------+----------+--------------+ CFV      Full           Yes      Yes                                 +---------+---------------+---------+-----------+----------+--------------+ SFJ      Full                                                        +---------+---------------+---------+-----------+----------+--------------+ FV Prox  Full                                                        +---------+---------------+---------+-----------+----------+--------------+ FV Mid   Full                                                         +---------+---------------+---------+-----------+----------+--------------+ FV DistalFull                                                        +---------+---------------+---------+-----------+----------+--------------+  PFV      Full                                                        +---------+---------------+---------+-----------+----------+--------------+ POP      Full           Yes      Yes                                 +---------+---------------+---------+-----------+----------+--------------+ PTV      Full                                                        +---------+---------------+---------+-----------+----------+--------------+ PERO     Full                                                        +---------+---------------+---------+-----------+----------+--------------+   +---------+---------------+---------+-----------+----------+--------------+ LEFT     CompressibilityPhasicitySpontaneityPropertiesThrombus Aging +---------+---------------+---------+-----------+----------+--------------+ CFV      Full           Yes      Yes                                 +---------+---------------+---------+-----------+----------+--------------+ SFJ      Full                                                        +---------+---------------+---------+-----------+----------+--------------+ FV Prox  Full                                                        +---------+---------------+---------+-----------+----------+--------------+ FV Mid   Full                                                        +---------+---------------+---------+-----------+----------+--------------+ FV DistalFull                                                        +---------+---------------+---------+-----------+----------+--------------+ PFV      Full                                                         +---------+---------------+---------+-----------+----------+--------------+  POP      Full           Yes      Yes                                 +---------+---------------+---------+-----------+----------+--------------+ PTV      Full                                                        +---------+---------------+---------+-----------+----------+--------------+ PERO     Full                                                        +---------+---------------+---------+-----------+----------+--------------+     Summary: RIGHT: - There is no evidence of deep vein thrombosis in the lower extremity.  - A cystic structure is found in the popliteal fossa measuring 5.5 x 1.7 cm.  LEFT: - There is no evidence of deep vein thrombosis in the lower extremity.  - A cystic structure is found in the popliteal fossa.  *See table(s) above for measurements and observations. Electronically signed by Coral Else MD on 01/16/2021 at 4:50:17 PM.    Final     Cardiac Studies   Echo: 01/16/21  IMPRESSIONS     1. There is a 19 mmHg systolic mid-cavitary gradient in the LV from  hyperdynamic LV contraction. Left ventricular ejection fraction, by  estimation, is >75%. The left ventricle has hyperdynamic function. The  left ventricle has no regional wall motion  abnormalities. There is mild left ventricular hypertrophy. Left  ventricular diastolic parameters are consistent with Grade I diastolic  dysfunction (impaired relaxation). Elevated left ventricular end-diastolic  pressure.   2. Right ventricular systolic function is normal. The right ventricular  size is normal.   3. Left atrial size was moderately dilated.   4. The mitral valve is abnormal. Trivial mitral valve regurgitation. Mild  mitral stenosis. The mean mitral valve gradient is 5.0 mmHg with average  heart rate of 75 bpm. Moderate mitral annular calcification.   5. The aortic valve is tricuspid. Aortic valve regurgitation is not   visualized. Mild aortic valve sclerosis is present, with no evidence of  aortic valve stenosis.   Comparison(s): No prior Echocardiogram.   FINDINGS   Left Ventricle: There is a 19 mmHg systolic mid-cavitary gradient in the  LV from hyperdynamic LV contraction. Left ventricular ejection fraction,  by estimation, is >75%. The left ventricle has hyperdynamic function. The  left ventricle has no regional  wall motion abnormalities. The left ventricular internal cavity size was  normal in size. There is mild left ventricular hypertrophy. Left  ventricular diastolic parameters are consistent with Grade I diastolic  dysfunction (impaired relaxation). Elevated  left ventricular end-diastolic pressure.   Right Ventricle: The right ventricular size is normal. No increase in  right ventricular wall thickness. Right ventricular systolic function is  normal.   Left Atrium: Left atrial size was moderately dilated.   Right Atrium: Right atrial size was normal in size.   Pericardium: There is no evidence of pericardial effusion.   Mitral Valve: The mitral valve is  abnormal. There is moderate  calcification of the posterior mitral valve leaflet(s). Moderate mitral  annular calcification. Trivial mitral valve regurgitation. Mild mitral  valve stenosis. The mean mitral valve gradient is   5.0 mmHg with average heart rate of 75 bpm.   Tricuspid Valve: The tricuspid valve is grossly normal. Tricuspid valve  regurgitation is trivial.   Aortic Valve: The aortic valve is tricuspid. Aortic valve regurgitation is  not visualized. Mild aortic valve sclerosis is present, with no evidence  of aortic valve stenosis.   Pulmonic Valve: The pulmonic valve was grossly normal. Pulmonic valve  regurgitation is trivial.   Aorta: The aortic root and ascending aorta are structurally normal, with  no evidence of dilitation.   Venous: The inferior vena cava was not well visualized.   IAS/Shunts: No atrial  level shunt detected by color flow Doppler.   Patient Profile     87 y.o. female with a hx of hypertension and hyperlipidemia who is being seen 01/16/2021 for the evaluation of NSTEMI at the request of Dr. Jerolyn Center.  Assessment & Plan    Elevated troponin: Serial troponin peaked at a 360 before trending back down. Symptoms were atypical in nature.  Echocardiogram showed hyperdynamic LV EF greater than 75% with mitral valve calcification.  CTA of the chest is negative for PE however shows severe coronary calcification. Options were reviewed with the patient via Dr. [place] yesterday and she prefers medical therapy. No recurrent chest pain.  -- will add ASA, statin therapy this morning. Continue coreg.   Elevated D-dimer: CTA of the chest negative for PE        Hypertension: Blood pressure elevated on arrival. Home HCTZ was stopped and started on coreg. She is tolerating well.   Hyperlipidemia: On Lipitor 10 mg daily.  [facility] will sign off.   Medication Recommendations:  noted above Other recommendations (labs, testing, etc):  none Follow up as an outpatient:  will arrange outpatient follow up in the office  For questions or updates, please contact [facility] Please consult [email or web address] for contact info under        Signed, Laverda Page, NP  01/17/2021, 10:30 AM

## 2021-01-17 NOTE — Discharge Summary (Signed)
Physician Discharge Summary  Kristie Hicks [facility] EQA:[id number] DOB: 1931-02-11 DOA: 01/15/2021  PCP: Melida Quitter, MD  Admit date: 01/15/2021 Discharge date: 01/17/2021  Admitted From: home Disposition:  home  Recommendations for Outpatient Follow-up:  Follow up with PCP in 1-2 weeks Please obtain BMP/CBC in one week Please follow up  with cardiology   Home Health: None Equipment/Devices: None Discharge Condition: Stable CODE STATUS: Full code Diet recommendation: Cardiac Brief/Interim Summary:Kristie Hicks is a 87 y.o. female with medical history significant of hypertension, hyperlipidemia presented to the ED complaining of palpitations and atypical chest pain.  Blood pressure elevated with systolic in the 170s on arrival to the ED.  Remainder of vital signs stable.  High-sensitivity troponin 16 >89 >170.  EKG without acute ischemic changes.  D-dimer 1.61. CTA negative for PE.  Does show small pericardial effusion and dense coronary artery calcifications.  Patient was started on IV heparin.  Cardiology recommended admission for routine work-up.   Patient reports she woke up this morning and had a funny feeling in her chest.  She does not remember what she was doing at that time.  She describes it as nonradiating left-sided chest tightness/pressure.  No associated dyspnea, diaphoresis, or nausea.  No palpitations.  She is not sure how severe the pain was.  She thinks this happened around 10 AM and subsided by noon when her daughter came to see her.  She then went to the ED to be evaluated.  Denies prior history of chest pain/discomfort.  Denies history of CAD.  Denies fevers or cough.  No other complaints.  Discharge Diagnoses:  Principal Problem:   NSTEMI (non-ST elevated myocardial infarction) (HCC) Active Problems:   HTN (hypertension)   HLD (hyperlipidemia)   Pulmonary nodule   Leukopenia   Elevated troponin  #1 elevated troponin secondary to demand ischemia in the  setting of  HTN.patient admitted with atypical chest pain with troponin peaking at 360.  Seen by cardiology   Patient and family opting for medical management. CT chest negative for PE but showed severe coronary artery calcification. Echo with ejection fraction 75%.    #2 essential hypertension poorly controlled on HCTZ at home which has been on hold.  Started carvedilol.   #3 hyperlipidemia checking lipid panel and TSH.     #4 lung nodules with no history of smoking no follow-up recommended by radiology    Estimated body mass index is 23.99 kg/m as calculated from the following:   Height as of this encounter: 5' 4.5" (1.638 m).   Weight as of this encounter: 64.4 kg.  Discharge Instructions  Discharge Instructions     Diet - low sodium heart healthy   Complete by: As directed    Diet - low sodium heart healthy   Complete by: As directed    Increase activity slowly   Complete by: As directed    Increase activity slowly   Complete by: As directed       Allergies as of 01/17/2021       Reactions   Mobic [meloxicam] Swelling   Feet and ankles swell   Robaxin [methocarbamol] Swelling   Feet and ankles swell        Medication List     STOP taking these medications    hydrochlorothiazide 25 MG tablet Commonly known as: HYDRODIURIL       TAKE these medications    aspirin EC 81 MG tablet Take 81 mg by mouth every morning.   atorvastatin 40 MG  tablet Commonly known as: LIPITOR Take 1 tablet (40 mg total) by mouth daily. Start taking on: January 18, 2021 What changed:  medication strength how much to take when to take this   carvedilol 6.25 MG tablet Commonly known as: COREG Take 1 tablet (6.25 mg total) by mouth 2 (two) times daily with a meal.   cholecalciferol 25 MCG (1000 UNIT) tablet Commonly known as: VITAMIN D3 Take 1,000 Units by mouth daily.   polyethylene glycol powder 17 GM/SCOOP powder Commonly known as: MiraLax Take 17 g by mouth  daily. What changed:  when to take this reasons to take this   vitamin B-12 1000 MCG tablet Commonly known as: CYANOCOBALAMIN Take 1,000 mcg by mouth daily.        Follow-up Information     Nadene Rubins Nyoka Cowden, MD Follow up.   Specialty: Internal Medicine Contact information: [address] [place] [address] [phone]                Allergies  Allergen Reactions   Mobic [Meloxicam] Swelling    Feet and ankles swell   Robaxin [Methocarbamol] Swelling    Feet and ankles swell    Consultations: cardiology   Procedures/Studies: DG Chest 2 View  Result Date: 01/15/2021 CLINICAL DATA:  Cardiac palpitations. EXAM: CHEST - 2 VIEW COMPARISON:  August 07, 2011. FINDINGS: The heart size and mediastinal contours are within normal limits. Both lungs are clear. The visualized skeletal structures are unremarkable. IMPRESSION: No active cardiopulmonary disease. Electronically Signed   By: Lupita Raider M.D.   On: 01/15/2021 14:01   CT Angio Chest PE W/Cm &/Or Wo Cm  Result Date: 01/15/2021 CLINICAL DATA:  Palpitations beginning this morning without chest pain. Some dizziness. History of hypertension. EXAM: CT ANGIOGRAPHY CHEST WITH CONTRAST TECHNIQUE: Multidetector CT imaging of the chest was performed using the standard protocol during bolus administration of intravenous contrast. Multiplanar CT image reconstructions and MIPs were obtained to evaluate the vascular anatomy. CONTRAST:  36mL OMNIPAQUE IOHEXOL 350 MG/ML SOLN COMPARISON:  Chest CT, 01/17/2016.  Current chest radiograph. FINDINGS: Cardiovascular: Pulmonary arteries are well opacified. There is no evidence of a pulmonary embolism. Main pulmonary artery measures 4 cm in diameter. Thoracic aorta is normal in caliber. There are atherosclerotic calcifications. Aorta minimally opacified. Heart is mildly enlarged. Small pericardial effusion. Three-vessel coronary artery calcifications. Mediastinum/Nodes: No neck base, mediastinal  or hilar masses or enlarged lymph nodes. Trachea and esophagus are unremarkable. Lungs/Pleura: Mild linear opacities most evident in the dependent lower lobes, consistent with atelectasis. 2 small, 4 mm, pulmonary nodules, right upper lobe, image 18 and image 54, series 7, both stable and benign. No new nodules. No evidence of pneumonia or pulmonary edema. No pleural effusion or pneumothorax. Upper Abdomen: No acute abnormality. Musculoskeletal: No fracture or acute finding. No bone lesion. No chest wall masses. Review of the MIP images confirms the above findings. IMPRESSION: 1. No evidence of a pulmonary embolism. 2. No acute findings. Lungs demonstrate mild dependent subsegmental atelectasis, but no evidence of pneumonia or edema. 3. Mild cardiomegaly. Small pericardial effusion. Dense coronary artery calcifications. 4. Enlarged main pulmonary artery similar to the prior CT suggesting pulmonary artery hypertension. 5. Aortic atherosclerosis. 6. Two small benign stable lung nodules.  No follow-up recommended. Aortic Atherosclerosis (ICD10-I70.0). Electronically Signed   By: Amie Portland M.D.   On: 01/15/2021 16:32   ECHOCARDIOGRAM COMPLETE  Result Date: 01/16/2021    ECHOCARDIOGRAM REPORT   Patient Name:   Kristie Hicks Date  of Exam: 01/16/2021 Medical Rec #:  [id number]              Height:       64.5 in Accession #:    [id number]             Weight:       142.0 lb Date of Birth:  [birth year]               BSA:          1.700 m Patient Age:    87 years               BP:           139/63 mmHg Patient Gender: F                      HR:           79 bpm. Exam Location:  Inpatient Procedure: 2D Echo, Color Doppler and Cardiac Doppler Indications:    NSTEMI  History:        Patient has no prior history of Echocardiogram examinations.                 Acute MI; Risk Factors:Hypertension and Dyslipidemia.  Sonographer:    Melissa Morford RDCS (AE, PE) Referring Phys: [id number] VASUNDHRA RATHORE IMPRESSIONS  1.  There is a 19 mmHg systolic mid-cavitary gradient in the LV from hyperdynamic LV contraction. Left ventricular ejection fraction, by estimation, is >75%. The left ventricle has hyperdynamic function. The left ventricle has no regional wall motion abnormalities. There is mild left ventricular hypertrophy. Left ventricular diastolic parameters are consistent with Grade I diastolic dysfunction (impaired relaxation). Elevated left ventricular end-diastolic pressure.  2. Right ventricular systolic function is normal. The right ventricular size is normal.  3. Left atrial size was moderately dilated.  4. The mitral valve is abnormal. Trivial mitral valve regurgitation. Mild mitral stenosis. The mean mitral valve gradient is 5.0 mmHg with average heart rate of 75 bpm. Moderate mitral annular calcification.  5. The aortic valve is tricuspid. Aortic valve regurgitation is not visualized. Mild aortic valve sclerosis is present, with no evidence of aortic valve stenosis. Comparison(s): No prior Echocardiogram. FINDINGS  Left Ventricle: There is a 19 mmHg systolic mid-cavitary gradient in the LV from hyperdynamic LV contraction. Left ventricular ejection fraction, by estimation, is >75%. The left ventricle has hyperdynamic function. The left ventricle has no regional wall motion abnormalities. The left ventricular internal cavity size was normal in size. There is mild left ventricular hypertrophy. Left ventricular diastolic parameters are consistent with Grade I diastolic dysfunction (impaired relaxation). Elevated left ventricular end-diastolic pressure. Right Ventricle: The right ventricular size is normal. No increase in right ventricular wall thickness. Right ventricular systolic function is normal. Left Atrium: Left atrial size was moderately dilated. Right Atrium: Right atrial size was normal in size. Pericardium: There is no evidence of pericardial effusion. Mitral Valve: The mitral valve is abnormal. There is moderate  calcification of the posterior mitral valve leaflet(s). Moderate mitral annular calcification. Trivial mitral valve regurgitation. Mild mitral valve stenosis. The mean mitral valve gradient is  5.0 mmHg with average heart rate of 75 bpm. Tricuspid Valve: The tricuspid valve is grossly normal. Tricuspid valve regurgitation is trivial. Aortic Valve: The aortic valve is tricuspid. Aortic valve regurgitation is not visualized. Mild aortic valve sclerosis is present, with no evidence of aortic valve stenosis. Pulmonic Valve: The pulmonic valve was grossly normal. Pulmonic valve regurgitation is  trivial. Aorta: The aortic root and ascending aorta are structurally normal, with no evidence of dilitation. Venous: The inferior vena cava was not well visualized. IAS/Shunts: No atrial level shunt detected by color flow Doppler.  LEFT VENTRICLE PLAX 2D LVIDd:         4.50 cm     Diastology LVIDs:         3.00 cm     LV e' medial:   4.13 cm/s LV PW:         0.80 cm     LV E/e' medial: 31.7 LV IVS:        0.70 cm LVOT diam:     2.00 cm LV SV:         62 LV SV Index:   37 LVOT Area:     3.14 cm  LV Volumes (MOD) LV vol d, MOD A2C: 47.6 ml LV vol d, MOD A4C: 44.6 ml LV vol s, MOD A2C: 19.8 ml LV vol s, MOD A4C: 20.9 ml LV SV MOD A2C:     27.8 ml LV SV MOD A4C:     44.6 ml LV SV MOD BP:      26.0 ml RIGHT VENTRICLE RV S prime:     18.50 cm/s TAPSE (M-mode): 1.9 cm LEFT ATRIUM             Index       RIGHT ATRIUM           Index LA diam:        3.90 cm 2.29 cm/m  RA Area:     16.50 cm LA Vol (A2C):   60.0 ml 35.29 ml/m RA Volume:   38.90 ml  22.88 ml/m LA Vol (A4C):   70.3 ml 41.35 ml/m LA Biplane Vol: 69.4 ml 40.82 ml/m  AORTIC VALVE LVOT Vmax:   117.00 cm/s LVOT Vmean:  82.000 cm/s LVOT VTI:    0.198 m  AORTA Ao Asc diam: 2.80 cm MITRAL VALVE MV Area (PHT): 2.72 cm     SHUNTS MV Mean grad:  5.0 mmHg     Systemic VTI:  0.20 m MV Decel Time: 279 msec     Systemic Diam: 2.00 cm MV E velocity: 131.00 cm/s MV A velocity: 168.00  cm/s MV E/A ratio:  0.78 Zoila Shutter MD Electronically signed by Zoila Shutter MD Signature Date/Time: 01/16/2021/10:24:45 AM    Final    VAS [place] LOWER EXTREMITY VENOUS (DVT)  Result Date: 01/16/2021  Lower Venous DVT Study Patient Name:  Kristie Hicks  Date of Exam:   01/16/2021 Medical Rec #: [id number]               Accession #:    [id number] Date of Birth: 08/15/1930                Patient Gender: F Patient Age:   9 years Exam Location:  [facility] Procedure:      VAS [place] LOWER EXTREMITY VENOUS (DVT) Referring Phys: Ulyess Blossom RATHORE --------------------------------------------------------------------------------  Indications: D-dimer.  Comparison Study: No prior studies. Performing Technologist: Jean Rosenthal RDMS, RVT  Examination Guidelines: A complete evaluation includes B-mode imaging, spectral Doppler, color Doppler, and power Doppler as needed of all accessible portions of each vessel. Bilateral testing is considered an integral part of a complete examination. Limited examinations for reoccurring indications may be performed as noted. The reflux portion of the exam is performed with the patient in reverse Trendelenburg.  +---------+---------------+---------+-----------+----------+--------------+ RIGHT    CompressibilityPhasicitySpontaneityPropertiesThrombus Aging +---------+---------------+---------+-----------+----------+--------------+  CFV      Full           Yes      Yes                                 +---------+---------------+---------+-----------+----------+--------------+ SFJ      Full                                                        +---------+---------------+---------+-----------+----------+--------------+ FV Prox  Full                                                        +---------+---------------+---------+-----------+----------+--------------+ FV Mid   Full                                                         +---------+---------------+---------+-----------+----------+--------------+ FV DistalFull                                                        +---------+---------------+---------+-----------+----------+--------------+ PFV      Full                                                        +---------+---------------+---------+-----------+----------+--------------+ POP      Full           Yes      Yes                                 +---------+---------------+---------+-----------+----------+--------------+ PTV      Full                                                        +---------+---------------+---------+-----------+----------+--------------+ PERO     Full                                                        +---------+---------------+---------+-----------+----------+--------------+   +---------+---------------+---------+-----------+----------+--------------+ LEFT     CompressibilityPhasicitySpontaneityPropertiesThrombus Aging +---------+---------------+---------+-----------+----------+--------------+ CFV      Full           Yes      Yes                                 +---------+---------------+---------+-----------+----------+--------------+  SFJ      Full                                                        +---------+---------------+---------+-----------+----------+--------------+ FV Prox  Full                                                        +---------+---------------+---------+-----------+----------+--------------+ FV Mid   Full                                                        +---------+---------------+---------+-----------+----------+--------------+ FV DistalFull                                                        +---------+---------------+---------+-----------+----------+--------------+ PFV      Full                                                         +---------+---------------+---------+-----------+----------+--------------+ POP      Full           Yes      Yes                                 +---------+---------------+---------+-----------+----------+--------------+ PTV      Full                                                        +---------+---------------+---------+-----------+----------+--------------+ PERO     Full                                                        +---------+---------------+---------+-----------+----------+--------------+     Summary: RIGHT: - There is no evidence of deep vein thrombosis in the lower extremity.  - A cystic structure is found in the popliteal fossa measuring 5.5 x 1.7 cm.  LEFT: - There is no evidence of deep vein thrombosis in the lower extremity.  - A cystic structure is found in the popliteal fossa.  *See table(s) above for measurements and observations. Electronically signed by Coral Else MD on 01/16/2021 at 4:50:17 PM.    Final    (Echo, Carotid, EGD, Colonoscopy, ERCP)    Subjective:  No further chest discomfort  Anxious to go home Discharge Exam: Vitals:   01/17/21  0354 01/17/21 0823  BP: (!) 121/57 (!) 118/54  Pulse: 67 70  Resp: 20 16  Temp: 98.5 F (36.9 C) 98.6 F (37 C)  SpO2: 97% 99%   Vitals:   01/16/21 1941 01/16/21 2313 01/17/21 0354 01/17/21 0823  BP: (!) 93/51 106/66 (!) 121/57 (!) 118/54  Pulse: 64 68 67 70  Resp: 18 19 20 16   Temp: 98.3 F (36.8 C) 98.6 F (37 C) 98.5 F (36.9 C) 98.6 F (37 C)  TempSrc: Oral Oral Oral Oral  SpO2: 98% 96% 97% 99%  Weight:      Height:        General: Pt is alert, awake, not in acute distress Cardiovascular: RRR, S1/S2 +, no rubs, no gallops Respiratory: CTA bilaterally, no wheezing, no rhonchi Abdominal: Soft, NT, ND, bowel sounds + Extremities: no edema, no cyanosis    The results of significant diagnostics from this hospitalization (including imaging, microbiology, ancillary and laboratory) are  listed below for reference.     Microbiology: Recent Results (from the past 240 hour(s))  Resp Panel by RT-PCR (Flu A&B, Covid) Nasopharyngeal Swab     Status: None   Collection Time: 01/15/21  5:21 PM   Specimen: Nasopharyngeal Swab; Nasopharyngeal(NP) swabs in vial transport medium  Result Value Ref Range Status   SARS Coronavirus 2 by RT PCR NEGATIVE NEGATIVE Final    Comment: (NOTE) SARS-CoV-2 target nucleic acids are NOT DETECTED.  The SARS-CoV-2 RNA is generally detectable in upper respiratory specimens during the acute phase of infection. The lowest concentration of SARS-CoV-2 viral copies this assay can detect is 138 copies/mL. A negative result does not preclude SARS-Cov-2 infection and should not be used as the sole basis for treatment or other patient management decisions. A negative result may occur with  improper specimen collection/handling, submission of specimen other than nasopharyngeal swab, presence of viral mutation(s) within the areas targeted by this assay, and inadequate number of viral copies(<138 copies/mL). A negative result must be combined with clinical observations, patient history, and epidemiological information. The expected result is Negative.  Fact Sheet for Patients:  BloggerCourse.com  Fact Sheet for Healthcare Providers:  SeriousBroker.it  This test is no t yet approved or cleared by the [place] FDA and  has been authorized for detection and/or diagnosis of SARS-CoV-2 by FDA under an Emergency Use Authorization (EUA). This EUA will remain  in effect (meaning this test can be used) for the duration of the COVID-19 declaration under Section 564(b)(1) of the Act, 21 U.S.C.section 360bbb-3(b)(1), unless the authorization is terminated  or revoked sooner.       Influenza A by PCR NEGATIVE NEGATIVE Final   Influenza B by PCR NEGATIVE NEGATIVE Final    Comment: (NOTE) The Xpert Xpress  SARS-CoV-2/FLU/RSV plus assay is intended as an aid in the diagnosis of influenza from Nasopharyngeal swab specimens and should not be used as a sole basis for treatment. Nasal washings and aspirates are unacceptable for Xpert Xpress SARS-CoV-2/FLU/RSV testing.  Fact Sheet for Patients: BloggerCourse.com  Fact Sheet for Healthcare Providers: SeriousBroker.it  This test is not yet approved or cleared by the [place] FDA and has been authorized for detection and/or diagnosis of SARS-CoV-2 by FDA under an Emergency Use Authorization (EUA). This EUA will remain in effect (meaning this test can be used) for the duration of the COVID-19 declaration under Section 564(b)(1) of the Act, 21 U.S.C. section 360bbb-3(b)(1), unless the authorization is terminated or revoked.  Performed at [facility], [id number]  [address] [place], [place], [place] [address]      Labs: BNP (last 3 results) No results for input(s): BNP in the last 8760 hours. Basic Metabolic Panel: Recent Labs  Lab 01/15/21 1328 01/17/21 0121  NA 140 139  K 3.5 3.7  CL 100 102  CO2 28 29  GLUCOSE 106* 97  BUN 28* 17  CREATININE 0.92 0.99  CALCIUM 10.5* 9.5   Liver Function Tests: No results for input(s): AST, ALT, ALKPHOS, BILITOT, PROT, ALBUMIN in the last 168 hours. No results for input(s): LIPASE, AMYLASE in the last 168 hours. No results for input(s): AMMONIA in the last 168 hours. CBC: Recent Labs  Lab 01/15/21 1328 01/16/21 0113 01/17/21 0121  WBC 3.5* 3.7* 3.3*  NEUTROABS  --  2.6  --   HGB 12.4 11.5* 11.3*  HCT 39.0 35.4* 34.1*  MCV 93.1 93.2 91.9  PLT 183 165 178   Cardiac Enzymes: No results for input(s): CKTOTAL, CKMB, CKMBINDEX, TROPONINI in the last 168 hours. BNP: Invalid input(s): POCBNP CBG: No results for input(s): GLUCAP in the last 168 hours. D-Dimer Recent Labs    01/15/21 1328  DDIMER 1.61*   Hgb A1c No results  for input(s): HGBA1C in the last 72 hours. Lipid Profile Recent Labs    01/17/21 0121  CHOL 188  HDL 65  LDLCALC 113*  TRIG 50  CHOLHDL 2.9   Thyroid function studies Recent Labs    01/16/21 0619  TSH 5.354*   Anemia work up No results for input(s): VITAMINB12, FOLATE, FERRITIN, TIBC, IRON, RETICCTPCT in the last 72 hours. Urinalysis    Component Value Date/Time   COLORURINE YELLOW 01/29/2011 0728   APPEARANCEUR CLEAR 01/29/2011 0728   LABSPEC 1.008 01/29/2011 0728   PHURINE 6.5 01/29/2011 0728   GLUCOSEU NEGATIVE 01/29/2011 0728   HGBUR TRACE (A) 01/29/2011 0728   BILIRUBINUR NEGATIVE 01/29/2011 0728   KETONESUR NEGATIVE 01/29/2011 0728   PROTEINUR NEGATIVE 01/29/2011 0728   UROBILINOGEN 0.2 01/29/2011 0728   NITRITE NEGATIVE 01/29/2011 0728   LEUKOCYTESUR TRACE (A) 01/29/2011 0728   Sepsis Labs Invalid input(s): PROCALCITONIN,  WBC,  LACTICIDVEN Microbiology Recent Results (from the past 240 hour(s))  Resp Panel by RT-PCR (Flu A&B, Covid) Nasopharyngeal Swab     Status: None   Collection Time: 01/15/21  5:21 PM   Specimen: Nasopharyngeal Swab; Nasopharyngeal(NP) swabs in vial transport medium  Result Value Ref Range Status   SARS Coronavirus 2 by RT PCR NEGATIVE NEGATIVE Final    Comment: (NOTE) SARS-CoV-2 target nucleic acids are NOT DETECTED.  The SARS-CoV-2 RNA is generally detectable in upper respiratory specimens during the acute phase of infection. The lowest concentration of SARS-CoV-2 viral copies this assay can detect is 138 copies/mL. A negative result does not preclude SARS-Cov-2 infection and should not be used as the sole basis for treatment or other patient management decisions. A negative result may occur with  improper specimen collection/handling, submission of specimen other than nasopharyngeal swab, presence of viral mutation(s) within the areas targeted by this assay, and inadequate number of viral copies(<138 copies/mL). A negative result  must be combined with clinical observations, patient history, and epidemiological information. The expected result is Negative.  Fact Sheet for Patients:  BloggerCourse.com  Fact Sheet for Healthcare Providers:  SeriousBroker.it  This test is no t yet approved or cleared by the [place] FDA and  has been authorized for detection and/or diagnosis of SARS-CoV-2 by FDA under an Emergency Use Authorization (EUA). This EUA will remain  in  effect (meaning this test can be used) for the duration of the COVID-19 declaration under Section 564(b)(1) of the Act, 21 U.S.C.section 360bbb-3(b)(1), unless the authorization is terminated  or revoked sooner.       Influenza A by PCR NEGATIVE NEGATIVE Final   Influenza B by PCR NEGATIVE NEGATIVE Final    Comment: (NOTE) The Xpert Xpress SARS-CoV-2/FLU/RSV plus assay is intended as an aid in the diagnosis of influenza from Nasopharyngeal swab specimens and should not be used as a sole basis for treatment. Nasal washings and aspirates are unacceptable for Xpert Xpress SARS-CoV-2/FLU/RSV testing.  Fact Sheet for Patients: BloggerCourse.com  Fact Sheet for Healthcare Providers: SeriousBroker.it  This test is not yet approved or cleared by the [place] FDA and has been authorized for detection and/or diagnosis of SARS-CoV-2 by FDA under an Emergency Use Authorization (EUA). This EUA will remain in effect (meaning this test can be used) for the duration of the COVID-19 declaration under Section 564(b)(1) of the Act, 21 U.S.C. section 360bbb-3(b)(1), unless the authorization is terminated or revoked.  Performed at [facility], [address], [place], [place] [address]      Time coordinating discharge: 39 minutes  SIGNED:   Alwyn Ren, MD  [facility] 01/17/2021, 1:56 PM

## 2021-01-17 NOTE — Progress Notes (Signed)
Pt discharged from unit. Medication/discharge instruction given  Luisangel Wainright K Cori Justus, RN  

## 2021-01-17 NOTE — Evaluation (Signed)
Physical Therapy Evaluation Patient Details Name: Kristie Hicks MRN: [id number] DOB: 1930-11-02 Today's Date: 01/17/2021  History of Present Illness  The pt is an 87 yo female presenting 10/4 due to chest pain. Suspected NSTEMI. PMH includes: HLD, HTN, and cataracts.  Clinical Impression  Pt in bed upon arrival of PT, agreeable to evaluation at this time. Prior to admission the pt was independent with mobility with use of SPC, living at home with her daughter, but independent with mobility and ADLs. The pt now presents with minor limitations in functional mobility, strength, and endurance, but reports she feels at her baseline in mobility at this time. The pt was able to complete bed mobility and sit-stand transfers without assist but does need SPC for sit-stand transfers. The pt was then able to complete ~200 ft hallway ambulation with supervision for safety, no LOB, and SpO2 remained 99-100% on RA. The pt will continue to benefit from skilled PT acutely to maintain mobility, progress functional endurance and gait speed/stability, but will be safe to return home with family support when medically stable.     Recommendations for follow up therapy are one component of a multi-disciplinary discharge planning process, led by the attending physician.  Recommendations may be updated based on patient status, additional functional criteria and insurance authorization.  Follow Up Recommendations No PT follow up;Supervision for mobility/OOB    Equipment Recommendations  None recommended by PT (pt has needed DME)    Recommendations for Other Services       Precautions / Restrictions Precautions Precautions: None Restrictions Weight Bearing Restrictions: No      Mobility  Bed Mobility Overal bed mobility: Modified Independent             General bed mobility comments: slightly increased time, no assist given    Transfers Overall transfer level: Modified independent Equipment  used: Straight cane             General transfer comment: pt needing no assist, good stability with rise and lower using cane  Ambulation/Gait Ambulation/Gait assistance: Supervision Gait Distance (Feet): 200 Feet Assistive device: Straight cane Gait Pattern/deviations: Step-through pattern;Decreased stride length;Trunk flexed Gait velocity: 0.2 m/s Gait velocity interpretation: <1.31 ft/sec, indicative of household ambulator General Gait Details: pt with slowed gait and slight trunk flexed. reports this is her nromal gait. no LOB      Balance Overall balance assessment: Mild deficits observed, not formally tested                                           Pertinent Vitals/Pain Pain Assessment: No/denies pain    Home Living Family/patient expects to be discharged to:: Private residence Living Arrangements: Children (daughter) Available Help at Discharge: Family;Available PRN/intermittently (daughter works during the day and pt home alone) Type of Home: House Home Access: Level entry     Home Layout: One level Home Equipment: Cane - single point      Prior Function Level of Independence: Independent with assistive device(s)         Comments: independent with SPC, does not drive at baseline     Hand Dominance   Dominant Hand: Right    Extremity/Trunk Assessment   Upper Extremity Assessment Upper Extremity Assessment: Overall WFL for tasks assessed    Lower Extremity Assessment Lower Extremity Assessment: Overall WFL for tasks assessed    Cervical / Trunk Assessment Cervical /  Trunk Assessment: Normal  Communication   Communication: No difficulties  Cognition Arousal/Alertness: Awake/alert Behavior During Therapy: WFL for tasks assessed/performed Overall Cognitive Status: Within Functional Limits for tasks assessed                                        General Comments General comments (skin integrity, edema,  etc.): SpO2 100% on RA with mobility        Assessment/Plan    PT Assessment Patient needs continued PT services  PT Problem List Decreased activity tolerance;Decreased balance;Decreased strength       PT Treatment Interventions DME instruction;Gait training;Functional mobility training;Stair training;Therapeutic activities;Therapeutic exercise;Balance training;Patient/family education    PT Goals (Current goals can be found in the Care Plan section)  Acute Rehab PT Goals Patient Stated Goal: return home PT Goal Formulation: With patient Time For Goal Achievement: 01/31/21 Potential to Achieve Goals: Good    Frequency Min 3X/week    AM-PAC PT "6 Clicks" Mobility  Outcome Measure Help needed turning from your back to your side while in a flat bed without using bedrails?: None Help needed moving from lying on your back to sitting on the side of a flat bed without using bedrails?: None Help needed moving to and from a bed to a chair (including a wheelchair)?: A Little Help needed standing up from a chair using your arms (e.g., wheelchair or bedside chair)?: A Little Help needed to walk in hospital room?: A Little Help needed climbing 3-5 steps with a railing? : A Little 6 Click Score: 20    End of Session Equipment Utilized During Treatment: Gait belt Activity Tolerance: Patient tolerated treatment well Patient left: in chair;with call bell/phone within reach;with chair alarm set Nurse Communication: Mobility status PT Visit Diagnosis: Other abnormalities of gait and mobility (R26.89);Muscle weakness (generalized) (M62.81)    Time: 8502-7741 PT Time Calculation (min) (ACUTE ONLY): 18 min   Charges:   PT Evaluation $PT Eval Low Complexity: 1 Low          Vickki Muff, PT, DPT   Acute Rehabilitation Department Pager #: [phone]  Ronnie Derby 01/17/2021, 10:01 AM

## 2021-03-01 ENCOUNTER — Telehealth: Payer: Self-pay

## 2021-03-01 NOTE — Telephone Encounter (Addendum)
Called patient left message for patient to call office regarding appointment on 12/8 needing to be rescheduled.----- Message from Corrin Parker, PA-C sent at 03/01/2021  8:02 AM EST ----- Rob Bunting. This is the patient that I was talking about. She is currently scheduled for 03/21/2021 at 3:45pm. Can we please reschedule her? There is currently an opening on 03/28/2021 at 10:55pm if that is still open we can move her there.  Thank you! Callie

## 2021-03-21 ENCOUNTER — Ambulatory Visit: Payer: Medicare Other | Admitting: Student

## 2021-03-23 NOTE — Progress Notes (Addendum)
Cardiology Office Note:    Date:  03/28/2021   ID:  Kristie Hicks, DOB 1930/05/23, MRN [id number]  PCP:  Melida Quitter, MD  Cardiologist:  Peter Swaziland, MD  Electrophysiologist:  None   Referring MD: Melida Quitter, MD   Chief Complaint: hospital follow-up for chest pain  History of Present Illness:    Kristie Hicks is a 87 y.o. female with a history of coronary calcifications noted on recent chest CT, hypertension, and hyperlipidemia who is followed by Dr. Swaziland and presents today for hospital follow-up for chest pain.   Patient was recently admitted from 01/15/2021 to 01/17/2021 after presenting with vague chest discomfort with associated dizziness and possible palpitations. She had no prior cardiac history and had never been seen by a Cardiologist prior to this. High-sensitivity troponin peaked at 360. D-dimer elevated at 1.6. Chest CTA showed no evidence of PE but did show dense coronary artery calcifications and 2 small benign stable lung nodules. Lower extremity ultrasound was negative for DVT. Echo showed LVEF of >75% with normal wall motion, mild LVH, grade 1 diastolic dysfunction, and mild mitral stenosis. Possible demand ischemia due to uncontrolled BP on arrival but unable to rule out ACS. However, patient elected to proceed with medical management. Home HCTZ was stopped and she was started on Coreg instead. She was started on Aspirin and home Lipitor was increased.  Patient presents today for follow-up. Here with daughter.  Patient has been doing well since being discharged from the hospital.  She denies any cardiac symptoms.  No chest pain, shortness of breath, orthopnea, PND, palpitations, lightheadedness, dizziness, syncope.  She does have chronic lower extremity edema (right > left); however, she states that this is stable.  Her BP is elevated today in the office at 160 greater than 78 on my check.  She states her systolic BP is typically in the 140s at home but has  been as low as 129.  Past Medical History:  Diagnosis Date   HL (hearing loss)    Hyperlipidemia    Hypertension    Rapid heart rate     Past Surgical History:  Procedure Laterality Date   CATARACT EXTRACTION      Current Medications: Current Meds  Medication Sig   aspirin EC 81 MG tablet Take 81 mg by mouth every morning.   atorvastatin (LIPITOR) 40 MG tablet Take 1 tablet (40 mg total) by mouth daily.   carvedilol (COREG) 6.25 MG tablet Take 1 tablet (6.25 mg total) by mouth 2 (two) times daily with a meal.   cholecalciferol (VITAMIN D3) 25 MCG (1000 UNIT) tablet Take 1,000 Units by mouth daily.   nitroGLYCERIN (NITROSTAT) 0.4 MG SL tablet Place 1 tablet (0.4 mg total) under the tongue every 5 (five) minutes as needed for chest pain.   polyethylene glycol powder (MIRALAX) powder Take 17 g by mouth daily. (Patient taking differently: Take 17 g by mouth daily as needed for mild constipation.)   vitamin B-12 (CYANOCOBALAMIN) 1000 MCG tablet Take 1,000 mcg by mouth daily.     Allergies:   Mobic [meloxicam] and Robaxin [methocarbamol]   Social History   Socioeconomic History   Marital status: Widowed    Spouse name: Not on file   Number of children: Not on file   Years of education: Not on file   Highest education level: Not on file  Occupational History   Not on file  Tobacco Use   Smoking status: Never   Smokeless tobacco: Never  Vaping Use   Vaping Use: Never used  Substance and Sexual Activity   Alcohol use: No   Drug use: Never   Sexual activity: Not on file  Other Topics Concern   Not on file  Social History Narrative   History of Smoking cigarettes: Never Smoked   No Alcohol   No recreational drug use   Occupation: Working   Investment banker, operational: Not on file  Food Insecurity: Not on file  Transportation Needs: Not on file  Physical Activity: Not on file  Stress: Not on file  Social Connections: Not on file      Family History: The patient's family history includes Sudden death in her father. There is no history of Diabetes.  ROS:   Please see the history of present illness.     EKGs/Labs/Other Studies Reviewed:    The following studies were reviewed today:  Echocardiogram 01/16/2021: Impressions:  1. There is a 19 mmHg systolic mid-cavitary gradient in the LV from  hyperdynamic LV contraction. Left ventricular ejection fraction, by  estimation, is >75%. The left ventricle has hyperdynamic function. The  left ventricle has no regional wall motion  abnormalities. There is mild left ventricular hypertrophy. Left  ventricular diastolic parameters are consistent with Grade I diastolic  dysfunction (impaired relaxation). Elevated left ventricular end-diastolic  pressure.   2. Right ventricular systolic function is normal. The right ventricular  size is normal.   3. Left atrial size was moderately dilated.   4. The mitral valve is abnormal. Trivial mitral valve regurgitation. Mild  mitral stenosis. The mean mitral valve gradient is 5.0 mmHg with average  heart rate of 75 bpm. Moderate mitral annular calcification.   5. The aortic valve is tricuspid. Aortic valve regurgitation is not  visualized. Mild aortic valve sclerosis is present, with no evidence of  aortic valve stenosis.   EKG:  EKG not ordered today.   Recent Labs: 01/16/2021: TSH 5.354 01/17/2021: BUN 17; Creatinine, Ser 0.99; Hemoglobin 11.3; Platelets 178; Potassium 3.7; Sodium 139  Recent Lipid Panel    Component Value Date/Time   CHOL 188 01/17/2021 0121   TRIG 50 01/17/2021 0121   HDL 65 01/17/2021 0121   CHOLHDL 2.9 01/17/2021 0121   VLDL 10 01/17/2021 0121   LDLCALC 113 (H) 01/17/2021 0121    Physical Exam:    Vital Signs: BP (!) 160/78   Pulse 74   Ht 5\' 4"  (1.626 m)   Wt 155 lb 6.4 oz (70.5 kg)   SpO2 100%   BMI 26.67 kg/m     Wt Readings from Last 3 Encounters:  03/28/21 155 lb 6.4 oz (70.5 kg)   01/15/21 141 lb 15.6 oz (64.4 kg)  01/09/15 172 lb (78 kg)     General: 87 y.o. African-American female in no acute distress. HEENT: Normocephalic and atraumatic. Sclera clear.  Neck: Supple. No carotid bruits. No JVD. Heart: RRR. Distinct S1 and S2. No murmurs, gallops, or rubs. Radial pulses 2+ and equal bilaterally. Lungs: No increased work of breathing. Clear to ausculation bilaterally. No wheezes, rhonchi, or rales.  Abdomen: Soft, non-distended, and non-tender to palpation.  Extremities: Mostly non-pitting edema of bilateral lower extremities, mostly ankles (right chronically greater than left).  Skin: Warm and dry. Neuro: Alert and oriented x3. No focal deficits. Psych: Normal affect. Responds appropriately.  Assessment:    1. Coronary artery disease involving native coronary artery of native heart without angina pectoris   2. Primary hypertension  3. Hyperlipidemia, unspecified hyperlipidemia type   4. Chronic lower extreimty edema     Plan:    CAD Patient recently admitted with vague chest pain and high-sensitivity troponin peaked at 360. Chest CTA was negative for PE but showed dense coronary calcifications. Possible demand ischemia due to uncontrolled BP but unable to rule out ACS. Patient elected to treat medically. - No recurrent chest pain. - Continue aspirin, beta-blocker, and high-intensity statin.  Hypertension BP elevated today in the 160s/70s. Patient states systolic BP usually in the 140s at home. - Continue Coreg 6.25mg  twice daily. - Patient states PCP recently started her on a medication to help with her chronic lower extremity edema but she cannot remember what this is. Suspect this is a diuretic. Patient/daughter will call us with this medications when she gets home. Will wait to make medication changes until I know what she is on. Suspect we will just need to increase her diuretic.  Hyperlipidemia Lipid panel during recent admission: Total Cholesterol  188, Triglycerides 50, HDL 65, LDL 113. LDL goal <70 given CAD. - Lipitor was increased from 10mg  to 40mg  daily during admission. Continue higher dose. - Will recheck lipid panel and CMET today.  Chronic Lower Extremity Edema. Stable. Right leg chronically larger than the right.  - She states her PCP recently started her on a memedication to help with her chronic lower extremity edema but she cannot remember what this is. Suspect this is a diuretic. Patient/daughter will call with this medications when she gets home.  Disposition: Follow up in 6 months.  ADDENDUM 03/28/2021 at 10:09PM: Daughter called back and stated patient is taking Lasix 20mg  daily for her lower extremity edema.    Patient was previously on HCTZ 25mg  daily before recent hospitalization. Given elevated BP, I would like to restart this and then I would recommend changing Lasix to 20mg  daily only as needed for worsening lower extremity edema. Also recommend compression stockings and limiting sodium intake to help with her lower extremity edema. Will have patient return for repeat BMET in 1 week to ensure renal function and electrolytes are stable.   Medication Adjustments/Labs and Tests Ordered: Current medicines are reviewed at length with the patient today.  Concerns regarding medicines are outlined above.  Orders Placed This Encounter  Procedures   Lipid panel   Comprehensive metabolic panel    Meds ordered this encounter  Medications   nitroGLYCERIN (NITROSTAT) 0.4 MG SL tablet    Sig: Place 1 tablet (0.4 mg total) under the tongue every 5 (five) minutes as needed for chest pain.    Dispense:  25 tablet    Refill:  2    Patient Instructions  Medication Instructions:  Place a Nitro under your tongue for chest pain every 5 minutes. DO NOT USE more than 3 tablets in an episode *If you need a refill on your cardiac medications before your next appointment, please call your pharmacy*  Lab Work: Your physician  recommends that you return for lab work TODAY:  CMET FLP If you have labs (blood work) drawn today and your tests are completely normal, you will receive your results only by: MyChart Message (if you have MyChart) OR A paper copy in the mail If you have any lab test that is abnormal or we need to change your treatment, we will call you to review the results.  Testing/Procedures: NONE ordered at this time of appointment   Follow-Up: At [facility], you and your health needs are our  priority.  As part of our continuing mission to provide you with exceptional heart care, we have created designated Provider Care Teams.  These Care Teams include your primary Cardiologist (physician) and Advanced Practice Providers (APPs -  Physician Assistants and Nurse Practitioners) who all work together to provide you with the care you need, when you need it.  Your next appointment:   6 month(s)  The format for your next appointment:   In Person  Provider:   Peter Swaziland, MD    Other Instructions KEEP a blood pressure and heart rate (BP/HR) log for 2-3 weeks and give our office a call with those readings.   Signed, Corrin Parker, PA-C  03/28/2021 11:10 AM    [facility]

## 2021-03-28 ENCOUNTER — Other Ambulatory Visit: Payer: Self-pay

## 2021-03-28 ENCOUNTER — Ambulatory Visit (INDEPENDENT_AMBULATORY_CARE_PROVIDER_SITE_OTHER): Payer: Medicare Other | Admitting: Student

## 2021-03-28 ENCOUNTER — Encounter: Payer: Self-pay | Admitting: Student

## 2021-03-28 ENCOUNTER — Telehealth: Payer: Self-pay | Admitting: Student

## 2021-03-28 VITALS — BP 160/78 | HR 74 | Ht 64.0 in | Wt 155.4 lb

## 2021-03-28 DIAGNOSIS — E785 Hyperlipidemia, unspecified: Secondary | ICD-10-CM

## 2021-03-28 DIAGNOSIS — I10 Essential (primary) hypertension: Secondary | ICD-10-CM | POA: Diagnosis not present

## 2021-03-28 DIAGNOSIS — R600 Localized edema: Secondary | ICD-10-CM

## 2021-03-28 DIAGNOSIS — Z79899 Other long term (current) drug therapy: Secondary | ICD-10-CM

## 2021-03-28 DIAGNOSIS — I2510 Atherosclerotic heart disease of native coronary artery without angina pectoris: Secondary | ICD-10-CM

## 2021-03-28 MED ORDER — NITROGLYCERIN 0.4 MG SL SUBL: 0.4000 mg | SUBLINGUAL_TABLET | SUBLINGUAL | 2 refills | Status: AC | PRN

## 2021-03-28 NOTE — Telephone Encounter (Signed)
°  Pt c/o medication issue:  1. Name of Medication: furosemide 20 mg   2. How are you currently taking this medication (dosage and times per day)? 1 tablet a day  3. Are you having a reaction (difficulty breathing--STAT)?   4. What is your medication issue? Pt's daughter calling to let Callie know this is is the medication that pt is taking for swelling

## 2021-03-28 NOTE — Patient Instructions (Signed)
Medication Instructions:  Place a Nitro under your tongue for chest pain every 5 minutes. DO NOT USE more than 3 tablets in an episode *If you need a refill on your cardiac medications before your next appointment, please call your pharmacy*  Lab Work: Your physician recommends that you return for lab work TODAY:  CMET FLP If you have labs (blood work) drawn today and your tests are completely normal, you will receive your results only by: MyChart Message (if you have MyChart) OR A paper copy in the mail If you have any lab test that is abnormal or we need to change your treatment, we will call you to review the results.  Testing/Procedures: NONE ordered at this time of appointment   Follow-Up: At [facility], you and your health needs are our priority.  As part of our continuing mission to provide you with exceptional heart care, we have created designated Provider Care Teams.  These Care Teams include your primary Cardiologist (physician) and Advanced Practice Providers (APPs -  Physician Assistants and Nurse Practitioners) who all work together to provide you with the care you need, when you need it.  Your next appointment:   6 month(s)  The format for your next appointment:   In Person  Provider:   Peter Swaziland, MD    Other Instructions KEEP a blood pressure and heart rate (BP/HR) log for 2-3 weeks and give our office a call with those readings.

## 2021-03-28 NOTE — Telephone Encounter (Signed)
Patient was previously on HCTZ 25mg  daily before recent hospitalization. Given elevated BP, I would like to restart this and then I would recommend changing Furosemide to 20mg  daily only as needed for worsening lower extremity edema. I would also recommend compression stockings and limiting sodium intake to help with her lower extremity edema.  I would also like patient to have a repeat BMET in 1 week after restarting her HCTZ to ensure that her kidney function and electrolytes are stable.   Thank you!

## 2021-03-29 LAB — LIPID PANEL
Chol/HDL Ratio: 2.8 ratio (ref 0.0–4.4)
Cholesterol, Total: 179 mg/dL (ref 100–199)
HDL: 65 mg/dL (ref >39)
LDL Chol Calc (NIH): 104 mg/dL — ABNORMAL HIGH (ref 0–99)
Triglycerides: 51 mg/dL (ref 0–149)
VLDL Cholesterol Cal: 10 mg/dL (ref 5–40)

## 2021-03-29 LAB — COMPREHENSIVE METABOLIC PANEL
ALT: 9 IU/L (ref 0–32)
AST: 20 IU/L (ref 0–40)
Albumin/Globulin Ratio: 1.6 (ref 1.2–2.2)
Albumin: 4.2 g/dL (ref 3.6–4.6)
Alkaline Phosphatase: 52 IU/L (ref 44–121)
BUN/Creatinine Ratio: 26 (ref 12–28)
BUN: 25 mg/dL (ref 8–27)
Bilirubin Total: 0.7 mg/dL (ref 0.0–1.2)
CO2: 22 mmol/L (ref 20–29)
Calcium: 9.7 mg/dL (ref 8.7–10.3)
Chloride: 103 mmol/L (ref 96–106)
Creatinine, Ser: 0.95 mg/dL (ref 0.57–1.00)
Globulin, Total: 2.7 g/dL (ref 1.5–4.5)
Glucose: 92 mg/dL (ref 70–99)
Potassium: 4.1 mmol/L (ref 3.5–5.2)
Sodium: 143 mmol/L (ref 134–144)
Total Protein: 6.9 g/dL (ref 6.0–8.5)
eGFR: 58 mL/min/{1.73_m2} — ABNORMAL LOW (ref >59)

## 2021-03-29 MED ORDER — HYDROCHLOROTHIAZIDE 25 MG PO TABS: 25.0000 mg | ORAL_TABLET | Freq: Every day | ORAL | 6 refills | Status: DC

## 2021-03-29 MED ORDER — FUROSEMIDE 20 MG PO TABS: 20.0000 mg | ORAL_TABLET | ORAL | 3 refills | Status: DC | PRN

## 2021-03-29 NOTE — Addendum Note (Signed)
Addended by: Alyson Ingles on: 03/29/2021 11:39 AM   Modules accepted: Orders

## 2021-03-29 NOTE — Telephone Encounter (Signed)
PT DAUGHTER Kristie Hicks,Kristie Hicks, Emergency Contact notified. Verbalized understanding. SHE WILL BRING PT BACK IN 1 WEEK FOR BMET

## 2021-04-19 ENCOUNTER — Other Ambulatory Visit: Payer: Self-pay | Admitting: Cardiology

## 2021-09-23 NOTE — Progress Notes (Signed)
Cardiology Office Note:    Date:  09/27/2021   ID:  Kristie Hicks, DOB 25-May-1930, MRN [id number]  PCP:  Melida Quitter, MD  Cardiologist:  Paloma Grange Sw[place], MD  Electrophysiologist:  None   Referring MD: Melida Quitter, MD   Chief Complaint: follow up chest pain  History of Present Illness:    Kristie Hicks is a 88 y.o. female with a history of coronary calcifications noted on chest CT, hypertension, and hyperlipidemia   Patient was admitted from 01/15/2021 to 01/17/2021 after presenting with vague chest discomfort with associated dizziness and possible palpitations.  High-sensitivity troponin peaked at 360. D-dimer elevated at 1.6. Chest CTA showed no evidence of PE but did show dense coronary artery calcifications and 2 small benign stable lung nodules. Lower extremity ultrasound was negative for DVT. Echo showed LVEF of >75% with normal wall motion, mild LVH, grade 1 diastolic dysfunction, and mild mitral stenosis. Possible demand ischemia due to uncontrolled BP on arrival but unable to rule out ACS. However, patient elected to proceed with medical management. Home HCTZ was stopped and she was started on Coreg instead. She was started on Aspirin and home Lipitor was increased. Last seen in December 2022 and doing well. HCTZ resumed for elevated BP.   On follow up today she is seen with her daughter. Denies any chest pain, dyspnea or palpitations. Has chronic swelling around her ankles- unchanged. Energy level is ok. No history of stroke or TIA. No bleeding history.   Past Medical History:  Diagnosis Date   HL (hearing loss)    Hyperlipidemia    Hypertension    Rapid heart rate     Past Surgical History:  Procedure Laterality Date   CATARACT EXTRACTION      Current Medications: Current Meds  Medication Sig   atorvastatin (LIPITOR) 40 MG tablet TAKE 1 TABLET BY MOUTH EVERY DAY   carvedilol (COREG) 6.25 MG tablet TAKE 1 TABLET BY MOUTH 2 TIMES DAILY WITH A MEAL.    cholecalciferol (VITAMIN D3) 25 MCG (1000 UNIT) tablet Take 1,000 Units by mouth daily.   hydrochlorothiazide (HYDRODIURIL) 25 MG tablet Take 1 tablet (25 mg total) by mouth daily.   nitroGLYCERIN (NITROSTAT) 0.4 MG SL tablet Place 1 tablet (0.4 mg total) under the tongue every 5 (five) minutes as needed for chest pain.   polyethylene glycol powder (MIRALAX) powder Take 17 g by mouth daily. (Patient taking differently: Take 17 g by mouth daily as needed for mild constipation.)   vitamin B-12 (CYANOCOBALAMIN) 1000 MCG tablet Take 1,000 mcg by mouth daily.   [DISCONTINUED] aspirin EC 81 MG tablet Take 81 mg by mouth every morning.   [DISCONTINUED] furosemide (LASIX) 20 MG tablet Take 1 tablet (20 mg total) by mouth as needed for fluid or edema (LE SWELLING). USE ONLY AS NEEDED     Allergies:   Mobic [meloxicam] and Robaxin [methocarbamol]   Social History   Socioeconomic History   Marital status: Widowed    Spouse name: Not on file   Number of children: Not on file   Years of education: Not on file   Highest education level: Not on file  Occupational History   Not on file  Tobacco Use   Smoking status: Never   Smokeless tobacco: Never  Vaping Use   Vaping Use: Never used  Substance and Sexual Activity   Alcohol use: No   Drug use: Never   Sexual activity: Not on file  Other Topics Concern  Not on file  Social History Narrative   History of Smoking cigarettes: Never Smoked   No Alcohol   No recreational drug use   Occupation: Working   Investment banker, operationalocial Determinants of Health   Financial Resource Strain: Not on file  Food Insecurity: Not on file  Transportation Needs: Not on file  Physical Activity: Not on file  Stress: Not on file  Social Connections: Not on file     Family History: The patient's family history includes Sudden death in her father. There is no history of Diabetes.  ROS:   Please see the history of present illness.     EKGs/Labs/Other Studies Reviewed:     The following studies were reviewed today:  Echocardiogram 01/16/2021: Impressions:  1. There is a 19 mmHg systolic mid-cavitary gradient in the LV from  hyperdynamic LV contraction. Left ventricular ejection fraction, by  estimation, is >75%. The left ventricle has hyperdynamic function. The  left ventricle has no regional wall motion  abnormalities. There is mild left ventricular hypertrophy. Left  ventricular diastolic parameters are consistent with Grade I diastolic  dysfunction (impaired relaxation). Elevated left ventricular end-diastolic  pressure.   2. Right ventricular systolic function is normal. The right ventricular  size is normal.   3. Left atrial size was moderately dilated.   4. The mitral valve is abnormal. Trivial mitral valve regurgitation. Mild  mitral stenosis. The mean mitral valve gradient is 5.0 mmHg with average  heart rate of 75 bpm. Moderate mitral annular calcification.   5. The aortic valve is tricuspid. Aortic valve regurgitation is not  visualized. Mild aortic valve sclerosis is present, with no evidence of  aortic valve stenosis.   EKG:  EKG ordered today. Afib with rate 74. Low voltage. Cannot rule out old anteroseptal infarct. I have personally reviewed and interpreted this study.   Recent Labs: 01/16/2021: TSH 5.354 01/17/2021: Hemoglobin 11.3; Platelets 178 03/28/2021: ALT 9; BUN 25; Creatinine, Ser 0.95; Potassium 4.1; Sodium 143  Recent Lipid Panel    Component Value Date/Time   CHOL 179 03/28/2021 1436   TRIG 51 03/28/2021 1436   HDL 65 03/28/2021 1436   CHOLHDL 2.8 03/28/2021 1436   CHOLHDL 2.9 01/17/2021 0121   VLDL 10 01/17/2021 0121   LDLCALC 104 (H) 03/28/2021 1436   Dated 05/08/21: cholesterol 178, triglycerides 54, HDL 63, LDL 104. BUN 29, Creatinine 1.17. TSH normal.    Physical Exam:    Vital Signs: BP 130/64   Pulse 74   Ht 5\' 4"  (1.626 m)   Wt 141 lb 3.2 oz (64 kg)   SpO2 100%   BMI 24.24 kg/m     Wt Readings from  Last 3 Encounters:  09/27/21 141 lb 3.2 oz (64 kg)  03/28/21 155 lb 6.4 oz (70.5 kg)  01/15/21 141 lb 15.6 oz (64.4 kg)     General: 88 y.o. African-American female in no acute distress. HEENT: Normocephalic and atraumatic. Sclera clear.  Neck: Supple. No carotid bruits. No JVD. Heart: IRRR. Normal S1 and S2. No murmurs, gallops, or rubs. Radial pulses 2+ and equal bilaterally. Lungs: No increased work of breathing. Clear to ausculation bilaterally. No wheezes, rhonchi, or rales.  Abdomen: Soft, non-distended, and non-tender to palpation.  Extremities: non-pitting swelling of bilateral ankles. Skin: Warm and dry. Neuro: Alert and oriented x3. No focal deficits. Psych: Normal affect. Responds appropriately.  Assessment:    1. Coronary artery disease involving native coronary artery of native heart without angina pectoris   2. Primary hypertension  3. Hyperlipidemia, unspecified hyperlipidemia type   4. Lower extremity edema      Plan:    CAD Patient admitted October 2022 with vague chest pain and high-sensitivity troponin peaked at 360. Chest CTA was negative for PE but showed dense coronary calcifications. Possible demand ischemia due to uncontrolled BP but unable to rule out ACS. Patient elected to treat medically. - No recurrent chest pain. - Continue  beta-blocker, and high-intensity statin.  2. Hypertension BP well controlled on current therapy. continue  3. Hyperlipidemia - Lipitor was increased from 10mg  to 40mg  daily during admission. Continue higher dose. - repeat lipids in Jan showed LDL 104.   4. Chronic ankle swelling Stable. Right leg chronically larger than the right.  Continue sodium restriction.  5. Afib new onset. Rate is controlled and she is asymptomatic. vasc score of 3. Recommend switching ASA to Eliquis 5 mg bid. Update labs including CBC, BMET and TFTs. Avoid NSAIDs. Given her age, the fact that she is asymptomatic and rate is controlled I would  not recommend DCCV unless symptoms progress.   Disposition: Follow up in 6 months with APP.     Medication Adjustments/Labs and Tests Ordered: Current medicines are reviewed at length with the patient today.  Concerns regarding medicines are outlined above.  No orders of the defined types were placed in this encounter.   No orders of the defined types were placed in this encounter.   There are no Patient Instructions on file for this visit.   Signed, Haakon Titsworth Feb, MD  09/27/2021 10:21 AM    [facility]

## 2021-09-27 ENCOUNTER — Encounter: Payer: Self-pay | Admitting: Cardiology

## 2021-09-27 ENCOUNTER — Ambulatory Visit (INDEPENDENT_AMBULATORY_CARE_PROVIDER_SITE_OTHER): Payer: Medicare Other | Admitting: Cardiology

## 2021-09-27 VITALS — BP 130/64 | HR 74 | Ht 64.0 in | Wt 141.2 lb

## 2021-09-27 DIAGNOSIS — I2510 Atherosclerotic heart disease of native coronary artery without angina pectoris: Secondary | ICD-10-CM

## 2021-09-27 DIAGNOSIS — R600 Localized edema: Secondary | ICD-10-CM | POA: Diagnosis not present

## 2021-09-27 DIAGNOSIS — I10 Essential (primary) hypertension: Secondary | ICD-10-CM | POA: Diagnosis not present

## 2021-09-27 DIAGNOSIS — E785 Hyperlipidemia, unspecified: Secondary | ICD-10-CM | POA: Diagnosis not present

## 2021-09-27 DIAGNOSIS — I4819 Other persistent atrial fibrillation: Secondary | ICD-10-CM

## 2021-09-27 DIAGNOSIS — I4891 Unspecified atrial fibrillation: Secondary | ICD-10-CM

## 2021-09-27 HISTORY — DX: Unspecified atrial fibrillation: I48.91

## 2021-09-27 MED ORDER — APIXABAN 5 MG PO TABS: 5.0000 mg | ORAL_TABLET | Freq: Two times a day (BID) | ORAL | 11 refills | Status: DC

## 2021-09-27 NOTE — Patient Instructions (Signed)
Medication Instructions:   -Stop aspirin.  -Start apixaban (eliquis) 5mg  twice daily.  *If you need a refill on your cardiac medications before your next appointment, please call your pharmacy*   Lab Work: Your physician recommends that you have labs drawn today: CBC, BMET, TSH and Free T4  If you have labs (blood work) drawn today and your tests are completely normal, you will receive your results only by: MyChart Message (if you have MyChart) OR A paper copy in the mail If you have any lab test that is abnormal or we need to change your treatment, we will call you to review the results.   Follow-Up: At [facility], you and your health needs are our priority.  As part of our continuing mission to provide you with exceptional heart care, we have created designated Provider Care Teams.  These Care Teams include your primary Cardiologist (physician) and Advanced Practice Providers (APPs -  Physician Assistants and Nurse Practitioners) who all work together to provide you with the care you need, when you need it.  We recommend signing up for the patient portal called "MyChart".  Sign up information is provided on this After Visit Summary.  MyChart is used to connect with patients for Virtual Visits (Telemedicine).  Patients are able to view lab/test results, encounter notes, upcoming appointments, etc.  Non-urgent messages can be sent to your provider as well.   To learn more about what you can do with MyChart, go to [facility].    Your next appointment:   6 month(s)  The format for your next appointment:   In Person  Provider:   [email or web address], FNP, Edd Fabian, PA-C, Micah Flesher, PA-C, Marjie Skiff, PA-C, Juanda Crumble, DNP, ANP, or Joni Reining, NP

## 2021-09-28 LAB — TSH+FREE T4
Free T4: 1.41 ng/dL (ref 0.82–1.77)
TSH: 3.35 u[IU]/mL (ref 0.450–4.500)

## 2021-09-28 LAB — CBC
Hematocrit: 31.9 % — ABNORMAL LOW (ref 34.0–46.6)
Hemoglobin: 10.7 g/dL — ABNORMAL LOW (ref 11.1–15.9)
MCH: 31 pg (ref 26.6–33.0)
MCHC: 33.5 g/dL (ref 31.5–35.7)
MCV: 93 fL (ref 79–97)
Platelets: 211 10*3/uL (ref 150–450)
RBC: 3.45 x10E6/uL — ABNORMAL LOW (ref 3.77–5.28)
RDW: 13.4 % (ref 11.7–15.4)
WBC: 3.1 10*3/uL — ABNORMAL LOW (ref 3.4–10.8)

## 2021-09-28 LAB — BASIC METABOLIC PANEL
BUN/Creatinine Ratio: 21 (ref 12–28)
BUN: 25 mg/dL (ref 8–27)
CO2: 25 mmol/L (ref 20–29)
Calcium: 10.2 mg/dL (ref 8.7–10.3)
Chloride: 102 mmol/L (ref 96–106)
Creatinine, Ser: 1.17 mg/dL — ABNORMAL HIGH (ref 0.57–1.00)
Glucose: 100 mg/dL — ABNORMAL HIGH (ref 70–99)
Potassium: 3.6 mmol/L (ref 3.5–5.2)
Sodium: 141 mmol/L (ref 134–144)
eGFR: 45 mL/min/{1.73_m2} — ABNORMAL LOW (ref >59)

## 2021-10-31 ENCOUNTER — Other Ambulatory Visit: Payer: Self-pay | Admitting: Student

## 2021-11-15 ENCOUNTER — Other Ambulatory Visit: Payer: Self-pay | Admitting: Cardiology

## 2022-03-25 NOTE — Progress Notes (Unsigned)
Cardiology Clinic Note   Patient Name: Kristie Hicks Date of Encounter: 03/26/2022  Primary Care Provider:  Melida Quitter, MD Primary Cardiologist:  Peter Swaziland, MD  Patient Profile    Kristie Hicks 88 year old female presents the clinic today for follow-up evaluation of her coronary artery disease and hypertension.  Past Medical History    Past Medical History:  Diagnosis Date   HL (hearing loss)    Hyperlipidemia    Hypertension    Rapid heart rate    Past Surgical History:  Procedure Laterality Date   CATARACT EXTRACTION      Allergies  Allergies  Allergen Reactions   Mobic [Meloxicam] Swelling    Feet and ankles swell   Robaxin [Methocarbamol] Swelling    Feet and ankles swell    History of Present Illness    Kristie Hicks has a PMH of coronary artery calcification noted on chest CT, hyperlipidemia, and HTN.  She was admitted 01/15/2021 discharged on 01/17/2021.  She reported vague chest discomfort that was associated with dizziness and possible palpitations.  Her high-sensitivity troponins peaked at 360.  Her D-dimer was elevated at 1.6.  A chest CTA showed no evidence of PE but did note coronary artery calcifications and 2 small benign stable lung nodules.  Her lower extremity ultrasound was negative for DVT.  Echocardiogram showed LV function greater than 75% with normal wall motion and G1 DD.  She was also noted to have mild mitral stenosis.  It was felt that she had demand ischemia due to uncontrolled blood pressure.  She was unable to be ruled out for ACS.  She elected to proceed with medical management.  Her home HCTZ was stopped and she was started on carvedilol.  She was started on aspirin and her atorvastatin was increased.  Subsequently her HCTZ was resumed due to elevated blood pressure.  She was seen in follow-up by Dr. Swaziland on 09/27/2021.  During that time she remained stable from a cardiac standpoint.  She denied chest pain  dyspnea and palpitations.  She was noted to have chronic ankle edema.  It was unchanged.  She denied bleeding issues and history of CVA or TIA.  She presents to the clinic today for follow-up evaluation and states she feels well.  She presents with her daughter.  Her weight has remained stable.  She reports compliance with her medications and denies bleeding issues.  We reviewed her EKG which shows atrial fibrillation that is rate controlled.  Her PCP follows her hyperlipidemia and she will have labs drawn in January.  Will plan follow-up in 6 months.  Today she denies chest pain, shortness of breath, increased lower extremity edema, fatigue, palpitations, melena, hematuria, hemoptysis, diaphoresis, weakness, presyncope, syncope, orthopnea, and PND.   Home Medications    Prior to Admission medications   Medication Sig Start Date End Date Taking? Authorizing Provider  apixaban (ELIQUIS) 5 MG TABS tablet Take 1 tablet (5 mg total) by mouth 2 (two) times daily. 09/27/21   [place], Peter M, MD  atorvastatin (LIPITOR) 40 MG tablet TAKE 1 TABLET BY MOUTH EVERY DAY 04/22/21   Marjie Skiff E, PA-C  carvedilol (COREG) 6.25 MG tablet TAKE 1 TABLET BY MOUTH 2 TIMES DAILY WITH A MEAL. 04/22/21   Marjie Skiff E, PA-C  cholecalciferol (VITAMIN D3) 25 MCG (1000 UNIT) tablet Take 1,000 Units by mouth daily.    [provider]  hydrochlorothiazide (HYDRODIURIL) 25 MG tablet TAKE 1 TABLET (25 MG TOTAL) BY  MOUTH DAILY. 11/15/21 02/13/22  [place], Peter M, MD  nitroGLYCERIN (NITROSTAT) 0.4 MG SL tablet Place 1 tablet (0.4 mg total) under the tongue every 5 (five) minutes as needed for chest pain. 03/28/21   Marjie Skiff E, PA-C  polyethylene glycol powder (MIRALAX) powder Take 17 g by mouth daily. Patient taking differently: Take 17 g by mouth daily as needed for mild constipation. 01/10/15   Palumbo, April, MD  vitamin B-12 (CYANOCOBALAMIN) 1000 MCG tablet Take 1,000 mcg by mouth daily.    [provider]    Family History    Family History  Problem Relation Age of Onset   Sudden death Father    Diabetes Neg Hx    She indicated that her mother is deceased. She indicated that her father is deceased. She indicated that the status of her neg hx is unknown.  Social History    Social History   Socioeconomic History   Marital status: Widowed    Spouse name: Not on file   Number of children: Not on file   Years of education: Not on file   Highest education level: Not on file  Occupational History   Not on file  Tobacco Use   Smoking status: Never   Smokeless tobacco: Never  Vaping Use   Vaping Use: Never used  Substance and Sexual Activity   Alcohol use: No   Drug use: Never   Sexual activity: Not on file  Other Topics Concern   Not on file  Social History Narrative   History of Smoking cigarettes: Never Smoked   No Alcohol   No recreational drug use   Occupation: Working   Investment banker, 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  Cardiovascular:  No chest pain, dyspnea on exertion, edema, orthopnea, palpitations, paroxysmal nocturnal dyspnea. Dermatological: No rash, lesions/masses Respiratory: No cough, dyspnea Urologic: No hematuria, dysuria Abdominal:   No nausea, vomiting, diarrhea, bright red blood per rectum, melena, or hematemesis Neurologic:  No visual changes, wkns, changes in mental status. All other systems reviewed and are otherwise negative except as noted above.  Physical Exam    VS:  BP 138/72   Pulse 83   Ht 5\' 4"  (1.626 m)   Wt 136 lb 6.4 oz (61.9 kg)   BMI 23.41 kg/m  , BMI Body mass index is 23.41 kg/m. GEN: Well nourished, well  developed, in no acute distress. HEENT: normal. Neck: Supple, no JVD, carotid bruits, or masses. Cardiac: RRR, no murmurs, rubs, or gallops. No clubbing, cyanosis, bilateral ankle edema.  Radials/DP/PT 2+ and equal bilaterally.  Respiratory:  Respirations regular and unlabored, clear to auscultation bilaterally. GI: Soft, nontender, nondistended, BS + x 4. MS: no deformity or atrophy. Skin: warm and dry, no rash. Neuro:  Strength and sensation are intact. Psych: Normal affect.  Accessory Clinical Findings    Recent Labs: 03/28/2021: ALT 9 09/27/2021: BUN 25; Creatinine, Ser 1.17; Hemoglobin 10.7; Platelets 211; Potassium 3.6; Sodium 141; TSH 3.350   Recent Lipid Panel    Component Value Date/Time   CHOL 179 03/28/2021 1436   TRIG 51 03/28/2021 1436   HDL 65 03/28/2021 1436  CHOLHDL 2.8 03/28/2021 1436   CHOLHDL 2.9 01/17/2021 0121   VLDL 10 01/17/2021 0121   LDLCALC 104 (H) 03/28/2021 1436         ECG personally reviewed by me today-atrial fibrillation 83 bpm- No acute changes  Echocardiogram 01/16/2021  1. There is a 19 mmHg systolic mid-cavitary gradient in the LV from  hyperdynamic LV contraction. Left ventricular ejection fraction, by  estimation, is >75%. The left ventricle has hyperdynamic function. The  left ventricle has no regional wall motion  abnormalities. There is mild left ventricular hypertrophy. Left  ventricular diastolic parameters are consistent with Grade I diastolic  dysfunction (impaired relaxation). Elevated left ventricular end-diastolic  pressure.   2. Right ventricular systolic function is normal. The right ventricular  size is normal.   3. Left atrial size was moderately dilated.   4. The mitral valve is abnormal. Trivial mitral valve regurgitation. Mild  mitral stenosis. The mean mitral valve gradient is 5.0 mmHg with average  heart rate of 75 bpm. Moderate mitral annular calcification.   5. The aortic valve is tricuspid. Aortic valve  regurgitation is not  visualized. Mild aortic valve sclerosis is present, with no evidence of  aortic valve stenosis.    Assessment & Plan   1.  Coronary artery disease-denies chest pain today.  No recent episodes of arm neck back or chest discomfort.  Coronary calcium identified on chest CTA.  Patient was noted to have elevated troponins during hospital admission, unable to rule out ACS.  She elected to treat medically. Continue carvedilol, atorvastatin Heart healthy low-sodium diet-salty 6 given Increase physical activity as tolerated  Hyperlipidemia-LDL 104 on 05/10/21. 03/28/2021: Cholesterol, Total 179; HDL 65; LDL Chol Calc (NIH) 104; Triglycerides 51 Continue atorvastatin, aspirin Heart healthy low-sodium high-fiber diet Increase physical activity as tolerated Plan for repeat lipids and LFTs 1/24-follows with PCP  Essential hypertension-BP today 138/72 Continue carvedilol, HCTZ Heart healthy low-sodium diet   Atrial fibrillation-CHA2DS2-VASc score 3.  Cardiac unaware.  Rate controlled.  Reports compliance with apixaban and denies bleeding issues. Continue carvedilol, apixaban Avoid triggers caffeine, chocolate, EtOH, dehydration etc.  Chronic ankle edema-weight stable.  Denies orthopnea and PND.  Reports compliance with low-sodium diet. Elevate lower extremities when not active Lower extremity support stockings  Disposition: Follow-up with Dr. Swaziland in 6 months.  Thomasene Ripple. Omauri Boeve NP-C     03/26/2022, 11:03 AM [facility] [address] Office [phone] Fax [phone]    I spent 13 minutes examining this patient, reviewing medications, and using patient centered shared decision making involving her cardiac care.  Prior to her visit I spent greater than 20 minutes reviewing her past medical history,  medications, and prior cardiac tests.

## 2022-03-26 ENCOUNTER — Encounter: Payer: Self-pay | Admitting: General Practice

## 2022-03-26 ENCOUNTER — Ambulatory Visit: Payer: Medicare Other | Attending: General Practice | Admitting: General Practice

## 2022-03-26 VITALS — BP 138/72 | HR 83 | Ht 64.0 in | Wt 136.4 lb

## 2022-03-26 DIAGNOSIS — E785 Hyperlipidemia, unspecified: Secondary | ICD-10-CM

## 2022-03-26 DIAGNOSIS — R600 Localized edema: Secondary | ICD-10-CM | POA: Diagnosis present

## 2022-03-26 DIAGNOSIS — I2510 Atherosclerotic heart disease of native coronary artery without angina pectoris: Secondary | ICD-10-CM

## 2022-03-26 DIAGNOSIS — I4819 Other persistent atrial fibrillation: Secondary | ICD-10-CM

## 2022-03-26 DIAGNOSIS — I10 Essential (primary) hypertension: Secondary | ICD-10-CM

## 2022-03-26 NOTE — Patient Instructions (Signed)
Medication Instructions:  The current medical regimen is effective;  continue present plan and medications as directed. Please refer to the Current Medication list given to you today.  *If you need a refill on your cardiac medications before your next appointment, please call your pharmacy*  Lab Work: AT PRIMARY, PLEASE HAVE THEM FORWARD TO US FOR REVIEW If you have labs (blood work) drawn today and your tests are completely normal, you will receive your results only by:  MyChart Message (if you have MyChart) OR A paper copy in the mail If you have any lab test that is abnormal or we need to change your treatment, we will call you to review the results.   OTHER: PLEASE READ AND FOLLOW HEART HEALTHY DIET-ATTACHED  PLEASE READ AND FOLLOW INCREASED FIBER DIET  INCREASE YOUR PHYSICAL ACTIVITY AS TOLERATED  Follow-Up: At [facility], you and your health needs are our priority.  As part of our continuing mission to provide you with exceptional heart care, we have created designated Provider Care Teams.  These Care Teams include your primary Cardiologist (physician) and Advanced Practice Providers (APPs -  Physician Assistants and Nurse Practitioners) who all work together to provide you with the care you need, when you need it. Your next appointment:   6 month(s)  The format for your next appointment:   In Person  Provider:   Peter SwazilandJordan, MD     Important Information About Sugar         Heart-Healthy Eating Plan Eating a healthy diet is important for the health of your heart. A heart-healthy eating plan includes: Eating less unhealthy fats. Eating more healthy fats. Eating less salt in your food. Salt is also called sodium. Making other changes in your diet. Talk with your doctor or a diet specialist (dietitian) to create an eating plan that is right for you.  What are tips for following this plan? Cooking Avoid frying your food. Try to bake, boil, grill, or broil  it instead. You can also reduce fat by: Removing the skin from poultry. Removing all visible fats from meats. Steaming vegetables in water or broth. Meal planning  At meals, divide your plate into four equal parts: Fill one-half of your plate with vegetables and green salads. Fill one-fourth of your plate with whole grains. Fill one-fourth of your plate with lean protein foods. Eat 2-4 cups of vegetables per day. One cup of vegetables is: 1 cup (91 g) broccoli or cauliflower florets. 2 medium carrots. 1 large bell pepper. 1 large sweet potato. 1 large tomato. 1 medium white potato. 2 cups (150 g) raw leafy greens. Eat 1-2 cups of fruit per day. One cup of fruit is: 1 small apple 1 large banana 1 cup (237 g) mixed fruit, 1 large orange,  cup (82 g) dried fruit, 1 cup (240 mL) 100% fruit juice. Eat more foods that have soluble fiber. These are apples, broccoli, carrots, beans, peas, and barley. Try to get 20-30 g of fiber per day. Eat 4-5 servings of nuts, legumes, and seeds per week: 1 serving of dried beans or legumes equals  cup (90 g) cooked. 1 serving of nuts is  oz (12 almonds, 24 pistachios, or 7 walnut halves). 1 serving of seeds equals  oz (8 g). General information Eat more home-cooked food. Eat less restaurant, buffet, and fast food. Limit or avoid alcohol. Limit foods that are high in starch and sugar. Avoid fried foods. Lose weight if you are overweight. Keep track of  how much salt (sodium) you eat. This is important if you have high blood pressure. Ask your doctor to tell you more about this. Try to add vegetarian meals each week. Fats Choose healthy fats. These include olive oil and canola oil, flaxseeds, walnuts, almonds, and seeds. Eat more omega-3 fats. These include salmon, mackerel, sardines, tuna, flaxseed oil, and ground flaxseeds. Try to eat fish at least 2 times each week. Check food labels. Avoid foods with trans fats or high amounts of saturated  fat. Limit saturated fats. These are often found in animal products, such as meats, butter, and cream. These are also found in plant foods, such as palm oil, palm kernel oil, and coconut oil. Avoid foods with partially hydrogenated oils in them. These have trans fats. Examples are stick margarine, some tub margarines, cookies, crackers, and other baked goods. What foods should I eat? Fruits All fresh, canned (in natural juice), or frozen fruits. Vegetables Fresh or frozen vegetables (raw, steamed, roasted, or grilled). Green salads. Grains Most grains. Choose whole wheat and whole grains most of the time. Rice and pasta, including brown rice and pastas made with whole wheat. Meats and other proteins Lean, well-trimmed beef, veal, pork, and lamb. Chicken and [place] without skin. All fish and shellfish. Wild duck, rabbit, pheasant, and venison. Egg whites or low-cholesterol egg substitutes. Dried beans, peas, lentils, and tofu. Seeds and most nuts. Dairy Low-fat or nonfat cheeses, including ricotta and mozzarella. Skim or 1% milk that is liquid, powdered, or evaporated. Buttermilk that is made with low-fat milk. Nonfat or low-fat yogurt. Fats and oils Non-hydrogenated (trans-free) margarines. Vegetable oils, including soybean, sesame, sunflower, olive, peanut, safflower, corn, canola, and cottonseed. Salad dressings or mayonnaise made with a vegetable oil. Beverages Mineral water. Coffee and tea. Diet carbonated beverages. Sweets and desserts Sherbet, gelatin, and fruit ice. Small amounts of dark chocolate. Limit all sweets and desserts. Seasonings and condiments All seasonings and condiments. The items listed above may not be a complete list of foods and drinks you can eat. Contact a dietitian for more options. What foods should I avoid? Fruits Canned fruit in heavy syrup. Fruit in cream or butter sauce. Fried fruit. Limit coconut. Vegetables Vegetables cooked in cheese, cream, or butter  sauce. Fried vegetables. Grains Breads that are made with saturated or trans fats, oils, or whole milk. Croissants. Sweet rolls. Donuts. High-fat crackers, such as cheese crackers. Meats and other proteins Fatty meats, such as hot dogs, ribs, sausage, bacon, rib-eye roast or steak. High-fat deli meats, such as salami and bologna. Caviar. Domestic duck and goose. Organ meats, such as liver. Dairy Cream, sour cream, cream cheese, and creamed cottage cheese. Whole-milk cheeses. Whole or 2% milk that is liquid, evaporated, or condensed. Whole buttermilk. Cream sauce or high-fat cheese sauce. Yogurt that is made from whole milk. Fats and oils Meat fat, or shortening. Cocoa butter, hydrogenated oils, palm oil, coconut oil, palm kernel oil. Solid fats and shortenings, including bacon fat, salt pork, lard, and butter. Nondairy cream substitutes. Salad dressings with cheese or sour cream. Beverages Regular sodas and juice drinks with added sugar. Sweets and desserts Frosting. Pudding. Cookies. Cakes. Pies. Milk chocolate or white chocolate. Buttered syrups. Full-fat ice cream or ice cream drinks. The items listed above may not be a complete list of foods and drinks to avoid. Contact a dietitian for more information. Summary Heart-healthy meal planning includes eating less unhealthy fats, eating more healthy fats, and making other changes in your diet. Eat a balanced diet.  This includes fruits and vegetables, low-fat or nonfat dairy, lean protein, nuts and legumes, whole grains, and heart-healthy oils and fats. This information is not intended to replace advice given to you by your health care provider. Make sure you discuss any questions you have with your health care provider. Document Revised: 05/06/2021 Document Reviewed: 05/06/2021 Elsevier Patient Education  2023 Elsevier Inc.   High-Fiber Eating Plan Fiber, also called dietary fiber, is a type of carbohydrate. It is found foods such as fruits,  vegetables, whole grains, and beans. A high-fiber diet can have many health benefits. Your health care provider may recommend a high-fiber diet to help: Prevent constipation. Fiber can make your bowel movements more regular. Lower your cholesterol. Relieve the following conditions: Inflammation of veins in the anus (hemorrhoids). Inflammation of specific areas of the digestive tract (uncomplicated diverticulosis). A problem of the large intestine, also called the colon, that sometimes causes pain and diarrhea (irritable bowel syndrome, or IBS). Prevent overeating as part of a weight-loss plan. Prevent heart disease, type 2 diabetes, and certain cancers. What are tips for following this plan? Reading food labels  Check the nutrition facts label on food products for the amount of dietary fiber. Choose foods that have 5 grams of fiber or more per serving. The goals for recommended daily fiber intake include: Men (age 49 or younger): 34-38 g. Men (over age 36): 28-34 g. Women (age 84 or younger): 25-28 g. Women (over age 66): 22-25 g. Your daily fiber goal is _____________ g. Shopping Choose whole fruits and vegetables instead of processed forms, such as apple juice or applesauce. Choose a wide variety of high-fiber foods such as avocados, lentils, oats, and kidney beans. Read the nutrition facts label of the foods you choose. Be aware of foods with added fiber. These foods often have high sugar and sodium amounts per serving. Cooking Use whole-grain flour for baking and cooking. Cook with brown rice instead of white rice. Meal planning Start the day with a breakfast that is high in fiber, such as a cereal that contains 5 g of fiber or more per serving. Eat breads and cereals that are made with whole-grain flour instead of refined flour or white flour. Eat brown rice, bulgur wheat, or millet instead of white rice. Use beans in place of meat in soups, salads, and pasta dishes. Be sure that  half of the grains you eat each day are whole grains. General information You can get the recommended daily intake of dietary fiber by: Eating a variety of fruits, vegetables, grains, nuts, and beans. Taking a fiber supplement if you are not able to take in enough fiber in your diet. It is better to get fiber through food than from a supplement. Gradually increase how much fiber you consume. If you increase your intake of dietary fiber too quickly, you may have bloating, cramping, or gas. Drink plenty of water to help you digest fiber. Choose high-fiber snacks, such as berries, raw vegetables, nuts, and popcorn. What foods should I eat? Fruits Berries. Pears. Apples. Oranges. Avocado. Prunes and raisins. Dried figs. Vegetables Sweet potatoes. Spinach. Kale. Artichokes. Cabbage. Broccoli. Cauliflower. Green peas. Carrots. Squash. Grains Whole-grain breads. Multigrain cereal. Oats and oatmeal. Brown rice. Barley. Bulgur wheat. Millet. Quinoa. Bran muffins. Popcorn. Rye wafer crackers. Meats and other proteins Navy beans, kidney beans, and pinto beans. Soybeans. Split peas. Lentils. Nuts and seeds. Dairy Fiber-fortified yogurt. Beverages Fiber-fortified soy milk. Fiber-fortified orange juice. Other foods Fiber bars. The items listed above may  not be a complete list of recommended foods and beverages. Contact a dietitian for more information. What foods should I avoid? Fruits Fruit juice. Cooked, strained fruit. Vegetables Fried potatoes. Canned vegetables. Well-cooked vegetables. Grains White bread. Pasta made with refined flour. White rice. Meats and other proteins Fatty cuts of meat. Fried chicken or fried fish. Dairy Milk. Yogurt. Cream cheese. Sour cream. Fats and oils Butters. Beverages Soft drinks. Other foods Cakes and pastries. The items listed above may not be a complete list of foods and beverages to avoid. Talk with your dietitian about what choices are best for  you. Summary Fiber is a type of carbohydrate. It is found in foods such as fruits, vegetables, whole grains, and beans. A high-fiber diet has many benefits. It can help to prevent constipation, lower blood cholesterol, aid weight loss, and reduce your risk of heart disease, diabetes, and certain cancers. Increase your intake of fiber gradually. Increasing fiber too quickly may cause cramping, bloating, and gas. Drink plenty of water while you increase the amount of fiber you consume. The best sources of fiber include whole fruits and vegetables, whole grains, nuts, seeds, and beans. This information is not intended to replace advice given to you by your health care provider. Make sure you discuss any questions you have with your health care provider. Document Revised: 08/04/2019 Document Reviewed: 08/04/2019 Elsevier Patient Education  2023 [employer].

## 2022-04-27 ENCOUNTER — Other Ambulatory Visit: Payer: Self-pay | Admitting: Student

## 2022-05-22 ENCOUNTER — Telehealth: Payer: Self-pay | Admitting: Hematology and Oncology

## 2022-05-22 NOTE — Telephone Encounter (Signed)
Scheduled appt per 2/8 referral. Pt is aware of appt date and time. Pt is aware to arrive 15 mins prior to appt time and to bring and updated insurance card. Pt is aware of appt location.   

## 2022-05-23 ENCOUNTER — Telehealth: Payer: Self-pay | Admitting: Hematology and Oncology

## 2022-05-23 NOTE — Telephone Encounter (Signed)
R/s appt per pt's daughter's request. She is aware of new appt date/time.

## 2022-05-30 ENCOUNTER — Other Ambulatory Visit: Payer: Medicare Other

## 2022-05-30 ENCOUNTER — Encounter: Payer: Medicare Other | Admitting: Hematology and Oncology

## 2022-06-02 ENCOUNTER — Other Ambulatory Visit: Payer: Self-pay

## 2022-06-02 ENCOUNTER — Inpatient Hospital Stay: Payer: Medicare HMO | Attending: Hematology and Oncology | Admitting: Hematology and Oncology

## 2022-06-02 ENCOUNTER — Inpatient Hospital Stay: Payer: Medicare HMO

## 2022-06-02 ENCOUNTER — Encounter: Payer: Self-pay | Admitting: Hematology and Oncology

## 2022-06-02 VITALS — BP 119/64 | HR 78 | Temp 97.8°F | Resp 14 | Ht 64.0 in | Wt 137.3 lb

## 2022-06-02 DIAGNOSIS — R634 Abnormal weight loss: Secondary | ICD-10-CM | POA: Insufficient documentation

## 2022-06-02 DIAGNOSIS — I10 Essential (primary) hypertension: Secondary | ICD-10-CM | POA: Insufficient documentation

## 2022-06-02 DIAGNOSIS — D72819 Decreased white blood cell count, unspecified: Secondary | ICD-10-CM | POA: Insufficient documentation

## 2022-06-02 DIAGNOSIS — E785 Hyperlipidemia, unspecified: Secondary | ICD-10-CM | POA: Diagnosis not present

## 2022-06-02 DIAGNOSIS — D72810 Lymphocytopenia: Secondary | ICD-10-CM | POA: Diagnosis not present

## 2022-06-02 LAB — CBC WITH DIFFERENTIAL/PLATELET
Abs Immature Granulocytes: 0.01 10*3/uL (ref 0.00–0.07)
Basophils Absolute: 0 10*3/uL (ref 0.0–0.1)
Basophils Relative: 1 %
Eosinophils Absolute: 0.1 10*3/uL (ref 0.0–0.5)
Eosinophils Relative: 2 %
HCT: 34 % — ABNORMAL LOW (ref 36.0–46.0)
Hemoglobin: 11 g/dL — ABNORMAL LOW (ref 12.0–15.0)
Immature Granulocytes: 0 %
Lymphocytes Relative: 21 %
Lymphs Abs: 0.7 10*3/uL (ref 0.7–4.0)
MCH: 30.6 pg (ref 26.0–34.0)
MCHC: 32.4 g/dL (ref 30.0–36.0)
MCV: 94.7 fL (ref 80.0–100.0)
Monocytes Absolute: 0.3 10*3/uL (ref 0.1–1.0)
Monocytes Relative: 9 %
Neutro Abs: 2.2 10*3/uL (ref 1.7–7.7)
Neutrophils Relative %: 67 %
Platelets: 223 10*3/uL (ref 150–400)
RBC: 3.59 MIL/uL — ABNORMAL LOW (ref 3.87–5.11)
RDW: 15.5 % (ref 11.5–15.5)
WBC: 3.3 10*3/uL — ABNORMAL LOW (ref 4.0–10.5)
nRBC: 0 % (ref 0.0–0.2)

## 2022-06-02 LAB — COMPREHENSIVE METABOLIC PANEL
ALT: 14 U/L (ref 0–44)
AST: 19 U/L (ref 15–41)
Albumin: 4.1 g/dL (ref 3.5–5.0)
Alkaline Phosphatase: 62 U/L (ref 38–126)
Anion gap: 6 (ref 5–15)
BUN: 22 mg/dL (ref 8–23)
CO2: 31 mmol/L (ref 22–32)
Calcium: 10.5 mg/dL — ABNORMAL HIGH (ref 8.9–10.3)
Chloride: 102 mmol/L (ref 98–111)
Creatinine, Ser: 1.05 mg/dL — ABNORMAL HIGH (ref 0.44–1.00)
GFR, Estimated: 51 mL/min — ABNORMAL LOW (ref >60)
Glucose, Bld: 92 mg/dL (ref 70–99)
Potassium: 4.1 mmol/L (ref 3.5–5.1)
Sodium: 139 mmol/L (ref 135–145)
Total Bilirubin: 0.9 mg/dL (ref 0.3–1.2)
Total Protein: 6.9 g/dL (ref 6.5–8.1)

## 2022-06-02 LAB — FERRITIN: Ferritin: 118 ng/mL (ref 11–307)

## 2022-06-02 LAB — IRON AND IRON BINDING CAPACITY (CC-WL,HP ONLY)
Iron: 70 ug/dL (ref 28–170)
Saturation Ratios: 23 % (ref 10.4–31.8)
TIBC: 305 ug/dL (ref 250–450)
UIBC: 235 ug/dL (ref 148–442)

## 2022-06-02 LAB — HEPATITIS PANEL, ACUTE
HCV Ab: NONREACTIVE
Hep A IgM: NONREACTIVE
Hep B C IgM: NONREACTIVE
Hepatitis B Surface Ag: NONREACTIVE

## 2022-06-02 LAB — TSH: TSH: 4.675 u[IU]/mL — ABNORMAL HIGH (ref 0.350–4.500)

## 2022-06-02 LAB — VITAMIN B12: Vitamin B-12: 1068 pg/mL — ABNORMAL HIGH (ref 180–914)

## 2022-06-02 NOTE — Progress Notes (Signed)
[facility] CONSULT NOTE  Patient Care Team: Michael Boston, MD as PCP - General (Internal Medicine) [place], Peter M, MD as PCP - Cardiology (Cardiology)  CHIEF COMPLAINTS/PURPOSE OF CONSULTATION:  Leukopenia  ASSESSMENT & PLAN:   This is a very pleasant 89 year old female patient with leukopenia, lymphopenia referred to hematology for evaluation and recommendations regarding the same. Patient denies any complaints at all.  She feels pretty normal, no significant change in her energy levels or unintentional loss of weight suddenly.  Physical examination today no palpable lymphadenopathy or hepatosplenomegaly, bilateral lower extremity swelling noted which is chronic.  She continues on her medications as recommended, no new medications added. We have discussed about common causes of leukopenia which can include nutritional deficiencies, viral illnesses, medications, autoimmune diseases, bone marrow disorders etc.  Given her age and worsening leukopenia, bone marrow diseases are certainly high on the differential.  He is agreeable to blood work.  She is not quite sure if she would like to pursue the bone marrow aspiration and biopsy.  She will return to clinic in about 2 to 3 weeks to review labs and discuss any additional recommendations.  Thank you for consulting [place] the care of this patient.  Please do not hesitate to contact us with any additional questions or concerns.  HISTORY OF PRESENTING ILLNESS:  Kristie Hicks 89 y.o. female is here because of leukopenia.  This is a very pleasant 89 year old female patient with history of hypertension, dyslipidemia referred to hematology for worsening leukopenia.  Patient has had chronic leukopenia which has gotten worse on her most recent labs.  She arrived to the appointment today with her daughter.  She was in a wheelchair.  At baseline, she is slow but functional, takes care of all her activities of daily living.  She lives  with her daughter who helps fix her meals although she can fix her own.  She denies any fevers, drenching night sweats, unintentional loss of weight.  She has lost weight over the years according to her daughter but this is not sudden.  She denies any new medications or known underlying autoimmune disease or nutritional deficiencies.  She has not noticed any change in her breathing, bowel habits or urinary habits.  She used to work in the [place], used to deal with the instruments etc.  No family history of cancer.  She is a never smoker, no history of alcohol.  Rest of the pertinent 10 point ROS reviewed and negative   MEDICAL HISTORY:  Past Medical History:  Diagnosis Date   HL (hearing loss)    Hyperlipidemia    Hypertension    Rapid heart rate     SURGICAL HISTORY: Past Surgical History:  Procedure Laterality Date   CATARACT EXTRACTION      SOCIAL HISTORY: Social History   Socioeconomic History   Marital status: Widowed    Spouse name: Not on file   Number of children: Not on file   Years of education: Not on file   Highest education level: Not on file  Occupational History   Not on file  Tobacco Use   Smoking status: Never   Smokeless tobacco: Never  Vaping Use   Vaping Use: Never used  Substance and Sexual Activity   Alcohol use: No   Drug use: Never   Sexual activity: Not on file  Other Topics Concern   Not on file  Social History Narrative   History of Smoking cigarettes: Never Smoked   No  Alcohol   No recreational drug use   Occupation: Working   Oncologist: Not on file  Food Insecurity: Not on file  Transportation Needs: Not on file  Physical Activity: Not on file  Stress: Not on file  Social Connections: Not on file  Intimate Partner Violence: Not on file    FAMILY HISTORY: Family History  Problem Relation Age of Onset   Sudden death Father    Diabetes Neg Hx     ALLERGIES:  is allergic to mobic  [meloxicam] and robaxin [methocarbamol].  MEDICATIONS:  Current Outpatient Medications  Medication Sig Dispense Refill   apixaban (ELIQUIS) 5 MG TABS tablet Take 1 tablet (5 mg total) by mouth 2 (two) times daily. 60 tablet 11   atorvastatin (LIPITOR) 40 MG tablet TAKE 1 TABLET BY MOUTH EVERY DAY 90 tablet 3   carvedilol (COREG) 6.25 MG tablet Take 1 tablet (6.25 mg total) by mouth 2 (two) times daily with a meal. 180 tablet 3   cholecalciferol (VITAMIN D3) 25 MCG (1000 UNIT) tablet Take 1,000 Units by mouth daily.     hydrochlorothiazide (HYDRODIURIL) 25 MG tablet TAKE 1 TABLET (25 MG TOTAL) BY MOUTH DAILY. 90 tablet 1   nitroGLYCERIN (NITROSTAT) 0.4 MG SL tablet Place 1 tablet (0.4 mg total) under the tongue every 5 (five) minutes as needed for chest pain. 25 tablet 2   polyethylene glycol powder (MIRALAX) powder Take 17 g by mouth daily. (Patient taking differently: Take 17 g by mouth daily as needed for mild constipation.) 255 g 0   vitamin B-12 (CYANOCOBALAMIN) 1000 MCG tablet Take 1,000 mcg by mouth daily.     No current facility-administered medications for this visit.     PHYSICAL EXAMINATION: ECOG PERFORMANCE STATUS: 0 - Asymptomatic  Vitals:   06/02/22 1008  BP: 119/64  Pulse: 78  Resp: 14  Temp: 97.8 F (36.6 C)  SpO2: 100%   Filed Weights   06/02/22 1008  Weight: 137 lb 4.8 oz (62.3 kg)    GENERAL:alert, no distress and comfortable SKIN: skin color, texture, turgor are normal, no rashes or significant lesions EYES: normal, conjunctiva are pink and non-injected, sclera clear OROPHARYNX:no exudate, no erythema and lips, buccal mucosa, and tongue normal  NECK: supple, thyroid normal size, non-tender, without nodularity LYMPH:  no palpable lymphadenopathy in the cervical, axillary  LUNGS: clear to auscultation and percussion with normal breathing effort HEART: regular rate & rhythm and no murmurs and no lower extremity edema ABDOMEN:abdomen soft, non-tender and  normal bowel sounds Musculoskeletal:no cyanosis of digits and no clubbing  PSYCH: alert & oriented x 3 with fluent speech NEURO: no focal motor/sensory deficits  LABORATORY DATA:  I have reviewed the data as listed Lab Results  Component Value Date   WBC 3.1 (L) 09/27/2021   HGB 10.7 (L) 09/27/2021   HCT 31.9 (L) 09/27/2021   MCV 93 09/27/2021   PLT 211 09/27/2021     Chemistry      Component Value Date/Time   NA 141 09/27/2021 1109   K 3.6 09/27/2021 1109   CL 102 09/27/2021 1109   CO2 25 09/27/2021 1109   BUN 25 09/27/2021 1109   CREATININE 1.17 (H) 09/27/2021 1109      Component Value Date/Time   CALCIUM 10.2 09/27/2021 1109   ALKPHOS 52 03/28/2021 1436   AST 20 03/28/2021 1436   ALT 9 03/28/2021 1436   BILITOT 0.7 03/28/2021 1436  RADIOGRAPHIC STUDIES: I have personally reviewed the radiological images as listed and agreed with the findings in the report. No results found.  All questions were answered. The patient knows to call the clinic with any problems, questions or concerns. I spent 45 minutes in the care of this patient including H and P, review of records, counseling and coordination of care.     Benay Pike, MD 06/02/2022 10:12 AM

## 2022-06-03 LAB — SURGICAL PATHOLOGY

## 2022-06-04 LAB — FOLATE RBC
Folate, Hemolysate: 239 ng/mL
Folate, RBC: 747 ng/mL (ref >498)
Hematocrit: 32 % — ABNORMAL LOW (ref 34.0–46.6)

## 2022-06-04 LAB — ANTINUCLEAR ANTIBODIES, IFA: ANA Ab, IFA: NEGATIVE

## 2022-06-04 LAB — FLOW CYTOMETRY

## 2022-06-13 ENCOUNTER — Telehealth: Payer: Self-pay | Admitting: Hematology and Oncology

## 2022-06-13 NOTE — Telephone Encounter (Signed)
rescheduled per 3/1 IB, msg left with pt

## 2022-06-16 ENCOUNTER — Ambulatory Visit: Payer: Medicare HMO | Admitting: Hematology and Oncology

## 2022-07-02 ENCOUNTER — Other Ambulatory Visit: Payer: Self-pay

## 2022-07-02 ENCOUNTER — Inpatient Hospital Stay: Payer: Medicare HMO | Attending: Hematology and Oncology | Admitting: Hematology and Oncology

## 2022-07-02 VITALS — BP 136/64 | HR 74 | Temp 98.6°F | Resp 16 | Ht 64.0 in | Wt 139.8 lb

## 2022-07-02 DIAGNOSIS — D72810 Lymphocytopenia: Secondary | ICD-10-CM | POA: Diagnosis not present

## 2022-07-02 DIAGNOSIS — E785 Hyperlipidemia, unspecified: Secondary | ICD-10-CM | POA: Insufficient documentation

## 2022-07-02 DIAGNOSIS — I10 Essential (primary) hypertension: Secondary | ICD-10-CM | POA: Diagnosis not present

## 2022-07-02 DIAGNOSIS — D72819 Decreased white blood cell count, unspecified: Secondary | ICD-10-CM

## 2022-07-02 NOTE — Progress Notes (Unsigned)
Kristie Hicks NOTE  Patient Care Team: Michael Boston, MD as PCP - General (Internal Medicine) [place], Peter M, MD as PCP - Cardiology (Cardiology)  CHIEF COMPLAINTS/PURPOSE OF CONSULTATION:  Leukopenia  ASSESSMENT & PLAN:   This is a very pleasant 89 year old female patient with leukopenia, lymphopenia referred to hematology for evaluation and recommendations regarding the same. She is here with her daughter today.  Since her last visit here she continues to do pretty well.  She is independent, walks with a walker at home.  We have reviewed the lab results which showed mild leukopenia and mild anemia with no evidence of thrombocytopenia.  Rest of the lab workup with no evidence of autoimmune diseases, nutritional deficiency, hepatitis or hypothyroidism.  At this time since her cytopenias are mild, given her advanced age, we have discussed about continuing surveillance.  She is agreeable to this.  She will return to clinic in about 4 months for repeat labs and for any further investigation.    Thank you for consulting [place] the care of this patient.  Please do not hesitate to contact us with any additional questions or concerns.  HISTORY OF PRESENTING ILLNESS:   Kristie Hicks 89 y.o. female is here because of leukopenia.  This is a very pleasant 89 year old female patient with history of hypertension, dyslipidemia referred to hematology for worsening leukopenia.  Patient has had chronic leukopenia which has gotten worse on her most recent labs.  She arrived to the appointment today with her daughter.  She was in a wheelchair.  Since her last visit here, she denies any complaints.  She is doing quite well.  Rest of the pertinent 10 point ROS reviewed and negative   MEDICAL HISTORY:  Past Medical History:  Diagnosis Date   HL (hearing loss)    Hyperlipidemia    Hypertension    Rapid heart rate     SURGICAL HISTORY: Past Surgical History:  Procedure Laterality  Date   CATARACT EXTRACTION      SOCIAL HISTORY: Social History   Socioeconomic History   Marital status: Widowed    Spouse name: Not on file   Number of children: Not on file   Years of education: Not on file   Highest education level: Not on file  Occupational History   Not on file  Tobacco Use   Smoking status: Never   Smokeless tobacco: Never  Vaping Use   Vaping Use: Never used  Substance and Sexual Activity   Alcohol use: No   Drug use: Never   Sexual activity: Not on file  Other Topics Concern   Not on file  Social History Narrative   History of Smoking cigarettes: Never Smoked   No Alcohol   No recreational drug use   Occupation: Working   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  MEDICATIONS:  Current Outpatient Medications  Medication Sig Dispense Refill   apixaban (ELIQUIS) 5 MG TABS tablet Take 1 tablet (5 mg total) by mouth 2 (two) times daily. 60 tablet  11   atorvastatin (LIPITOR) 40 MG tablet TAKE 1 TABLET BY MOUTH EVERY DAY 90 tablet 3   carvedilol (COREG) 6.25 MG tablet Take 1 tablet (6.25 mg total) by mouth 2 (two) times daily with a meal. 180 tablet 3   cholecalciferol (VITAMIN D3) 25 MCG (1000 UNIT) tablet Take 1,000 Units by mouth daily.     hydrochlorothiazide (HYDRODIURIL) 25 MG tablet TAKE 1 TABLET (25 MG TOTAL) BY MOUTH DAILY. 90 tablet 1   nitroGLYCERIN (NITROSTAT) 0.4 MG SL tablet Place 1 tablet (0.4 mg total) under the tongue every 5 (five) minutes as needed for chest pain. 25 tablet 2   polyethylene glycol powder (MIRALAX) powder  Take 17 g by mouth daily. (Patient taking differently: Take 17 g by mouth daily as needed for mild constipation.) 255 g 0   vitamin B-12 (CYANOCOBALAMIN) 1000 MCG tablet Take 1,000 mcg by mouth daily.     No current facility-administered medications for this visit.     PHYSICAL EXAMINATION: ECOG PERFORMANCE STATUS: 0 - Asymptomatic  Vitals:   07/02/22 1504  BP: 136/64  Pulse: 74  Resp: 16  Temp: 98.6 F (37 C)  SpO2: 98%   Filed Weights   07/02/22 1504  Weight: 139 lb 12.8 oz (63.4 kg)    GENERAL:alert, no distress and comfortable SKIN: skin color, texture, turgor are normal, no rashes or significant lesions EYES: normal, conjunctiva are pink and non-injected, sclera clear OROPHARYNX:no exudate, no erythema and lips, buccal mucosa, and tongue normal  NECK: supple, thyroid normal size, non-tender, without nodularity LYMPH:  no palpable lymphadenopathy in the cervical, axillary  LUNGS: clear to auscultation and percussion with normal breathing effort HEART: regular rate & rhythm and no murmurs and no lower extremity edema ABDOMEN:abdomen soft, non-tender and normal bowel sounds Musculoskeletal:no cyanosis of digits and no clubbing  PSYCH: alert & oriented x 3 with fluent speech NEURO: no focal motor/sensory deficits  LABORATORY DATA:  I have reviewed the data as listed Lab Results  Component Value Date   WBC 3.3 (L) 06/02/2022   HGB 11.0 (L) 06/02/2022   HCT 32.0 (L) 06/02/2022   MCV 94.7 06/02/2022   PLT 223 06/02/2022     Chemistry      Component Value Date/Time   NA 139 06/02/2022 1043   NA 141 09/27/2021 1109   K 4.1 06/02/2022 1043   CL 102 06/02/2022 1043   CO2 31 06/02/2022 1043   BUN 22 06/02/2022 1043   BUN 25 09/27/2021 1109   CREATININE 1.05 (H) 06/02/2022 1043      Component Value Date/Time   CALCIUM 10.5 (H) 06/02/2022 1043   ALKPHOS 62 06/02/2022 1043   AST 19 06/02/2022 1043   ALT 14 06/02/2022 1043   BILITOT 0.9 06/02/2022 1043   BILITOT  0.7 03/28/2021 1436       RADIOGRAPHIC STUDIES: I have personally reviewed the radiological images as listed and agreed with the findings in the report. No results found.  All questions were answered. The patient knows to call the clinic with any problems, questions or concerns. I spent 20 minutes in the care of this patient including H and P, review of records, counseling and coordination of care.     Benay Pike, MD 07/02/2022 3:11 PM

## 2022-07-16 ENCOUNTER — Telehealth: Payer: Self-pay | Admitting: Cardiology

## 2022-07-16 ENCOUNTER — Other Ambulatory Visit: Payer: Self-pay | Admitting: Cardiology

## 2022-07-16 DIAGNOSIS — I4819 Other persistent atrial fibrillation: Secondary | ICD-10-CM

## 2022-07-16 NOTE — Telephone Encounter (Signed)
*  STAT* If patient is at the pharmacy, call can be transferred to refill team.   1. Which medications need to be refilled? (please list name of each medication and dose if known)   apixaban (ELIQUIS) 5 MG TABS tablet  Take 1 tablet (5 mg total) by mouth 2 (two) times daily.   2. Which pharmacy/location (including street and city if local pharmacy) is medication to be sent to? [id number] [place], [place] - [address]    3. Do they need a 30 day or 90 day supply? 30 Day Supply  Pt takes their last pill today

## 2022-07-17 MED ORDER — APIXABAN 5 MG PO TABS: 5.0000 mg | ORAL_TABLET | Freq: Two times a day (BID) | ORAL | 5 refills | Status: DC

## 2022-07-17 NOTE — Telephone Encounter (Addendum)
Eliquis 5mg  refill request received. Patient is 89 years old, weight-63.4kg, Crea-1.05 on 06/02/22, Diagnosis-Afib, and last seen by Coletta Memos on 03/26/22. Dose is appropriate based on dosing criteria. Will send in refill to requested pharmacy.    Pharmacy requesting 90 day supply.

## 2022-07-17 NOTE — Telephone Encounter (Signed)
Prescription refill request for Eliquis received. Indication: AF Last office visit: 03/26/22  Thomasene Mohair NP Scr: 1.05 on 06/02/22  Epic Age: 89 Weight: 61.9kg  Based on above findings Eliquis 5mg  twice daily is the appropriate dose.  Refill approved.

## 2022-09-14 NOTE — Progress Notes (Unsigned)
Cardiology Office Note:    Date:  09/15/2022   ID:  Kristie Hicks, DOB [birth year], MRN [id number]  PCP:  Melida Quitter, MD  Cardiologist:  Yvaine Jankowiak [place], MD  Electrophysiologist:  None   Referring MD: Melida Quitter, MD   Chief Complaint: follow up Afib  History of Present Illness:    Kristie Hicks is a 89 y.o. female with a history of coronary calcifications noted on chest CT, Afib, hypertension, and hyperlipidemia   Patient was admitted from 01/15/2021 to 01/17/2021 after presenting with vague chest discomfort with associated dizziness and possible palpitations.  High-sensitivity troponin peaked at 360. D-dimer elevated at 1.6. Chest CTA showed no evidence of PE but did show dense coronary artery calcifications and 2 small benign stable lung nodules. Lower extremity ultrasound was negative for DVT. Echo showed LVEF of >75% with normal wall motion, mild LVH, grade 1 diastolic dysfunction, and mild mitral stenosis. Possible demand ischemia due to uncontrolled BP on arrival but unable to rule out ACS. However, patient elected to proceed with medical management. Home HCTZ was stopped and she was started on Coreg instead. She was started on Aspirin and home Lipitor was increased. Last seen in December 2022 and doing well. HCTZ resumed for elevated BP. On subsequent follow up was found to be in AFib with controlled rate. She was asymptomatic. Started on Eliquis.   On follow up today she is seen with her daughter. Denies any chest pain, dyspnea or palpitations. Has chronic swelling in her legs- unchanged. Energy level is ok. No history of stroke or TIA. No bleeding history.   Past Medical History:  Diagnosis Date   HL (hearing loss)    Hyperlipidemia    Hypertension    Rapid heart rate     Past Surgical History:  Procedure Laterality Date   CATARACT EXTRACTION      Current Medications: Current Meds  Medication Sig   apixaban (ELIQUIS) 5 MG TABS tablet Take 1 tablet (5 mg  total) by mouth 2 (two) times daily.   atorvastatin (LIPITOR) 40 MG tablet TAKE 1 TABLET BY MOUTH EVERY DAY   carvedilol (COREG) 6.25 MG tablet Take 1 tablet (6.25 mg total) by mouth 2 (two) times daily with a meal.   cholecalciferol (VITAMIN D3) 25 MCG (1000 UNIT) tablet Take 1,000 Units by mouth daily.   hydrochlorothiazide (HYDRODIURIL) 25 MG tablet TAKE 1 TABLET (25 MG TOTAL) BY MOUTH DAILY.   polyethylene glycol powder (MIRALAX) powder Take 17 g by mouth daily. (Patient taking differently: Take 17 g by mouth daily as needed for mild constipation.)   vitamin B-12 (CYANOCOBALAMIN) 1000 MCG tablet Take 1,000 mcg by mouth daily.     Allergies:   Mobic [meloxicam] and Robaxin [methocarbamol]   Social History   Socioeconomic History   Marital status: Widowed    Spouse name: Not on file   Number of children: Not on file   Years of education: Not on file   Highest education level: Not on file  Occupational History   Not on file  Tobacco Use   Smoking status: Never   Smokeless tobacco: Never  Vaping Use   Vaping Use: Never used  Substance and Sexual Activity   Alcohol use: No   Drug use: Never   Sexual activity: Not on file  Other Topics Concern   Not on file  Social History Narrative   History of Smoking cigarettes: Never Smoked   No Alcohol   No recreational drug use  Occupation: Working   Investment banker, 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 There is no history of Diabetes.  ROS:   Please see the history of present illness.     EKGs/Labs/Other Studies Reviewed:    The following studies were reviewed today:  Echocardiogram 01/16/2021: Impressions:  1. There is a 19 mmHg systolic mid-cavitary gradient in the LV  from  hyperdynamic LV contraction. Left ventricular ejection fraction, by  estimation, is >75%. The left ventricle has hyperdynamic function. The  left ventricle has no regional wall motion  abnormalities. There is mild left ventricular hypertrophy. Left  ventricular diastolic parameters are consistent with Grade I diastolic  dysfunction (impaired relaxation). Elevated left ventricular end-diastolic  pressure.   2. Right ventricular systolic function is normal. The right ventricular  size is normal.   3. Left atrial size was moderately dilated.   4. The mitral valve is abnormal. Trivial mitral valve regurgitation. Mild  mitral stenosis. The mean mitral valve gradient is 5.0 mmHg with average  heart rate of 75 bpm. Moderate mitral annular calcification.   5. The aortic valve is tricuspid. Aortic valve regurgitation is not  visualized. Mild aortic valve sclerosis is present, with no evidence of  aortic valve stenosis.   EKG:  EKG not ordered today.    Recent Labs: 06/02/2022: ALT 14; BUN 22; Creatinine, Ser 1.05; Hemoglobin 11.0; Platelets 223; Potassium 4.1; Sodium 139; TSH 4.675  Recent Lipid Panel    Component Value Date/Time   CHOL 179 03/28/2021 1436   TRIG 51 03/28/2021 1436   HDL 65 03/28/2021 1436   CHOLHDL 2.8 03/28/2021 1436   CHOLHDL 2.9 01/17/2021 0121   VLDL 10 01/17/2021 0121   LDLCALC 104 (H) 03/28/2021 1436   Dated 05/08/21: cholesterol 178, triglycerides 54, HDL 63, LDL 104. BUN 29, Creatinine 1.17. TSH normal.    Physical Exam:    Vital Signs: BP 124/74   Pulse 78   Ht 5\' 4"  (1.626 m)   Wt 138 lb 3.2 oz (62.7 kg)   SpO2 93%   BMI 23.72 kg/m     Wt Readings from Last 3 Encounters:  09/15/22 138 lb 3.2 oz (62.7 kg)  07/02/22 139 lb 12.8 oz (63.4 kg)  06/02/22 137 lb 4.8 oz (62.3 kg)     General: 89 y.o. African-American female in no acute distress. HEENT: Normocephalic and atraumatic. Sclera clear.  Neck: Supple. No carotid bruits. No JVD. Heart:  IRRR. Normal S1 and S2. No murmurs, gallops, or rubs. Radial pulses 2+ and equal bilaterally. Lungs: No increased work of breathing. Clear to ausculation bilaterally. No wheezes, rhonchi, or rales.  Abdomen: Soft, non-distended, and non-tender to palpation.  Extremities: non-pitting swelling of bilateral ankles. Skin: Warm and dry. Neuro: Alert and oriented x3. No focal deficits. Psych: Normal affect. Responds appropriately.  Assessment:    1. Persistent atrial fibrillation (HCC)   2. Coronary artery disease involving native coronary artery of native heart without angina pectoris   3. Primary hypertension   4. Hyperlipidemia, unspecified hyperlipidemia type       Plan:    CAD Patient admitted October 2022 with vague chest pain and high-sensitivity troponin peaked at 360. Chest CTA was negative for PE but showed  dense coronary calcifications. Possible demand ischemia due to uncontrolled BP but unable to rule out ACS. Patient elected to treat medically. - No recurrent chest pain. - Continue  beta-blocker, and high-intensity statin.  2. Hypertension BP well controlled on current therapy. continue  3. Hyperlipidemia - on lipitor 40 mg  - repeat lipids in Jan showed LDL 104.   4. Chronic ankle swelling Stable.  Continue sodium restriction.  5. Afib new onset. Rate is controlled and she is asymptomatic. [place] vasc score of 3. Continue rate control and anticoagulation with Eliquis  Disposition: Follow up in one year with APP.     Medication Adjustments/Labs and Tests Ordered: Current medicines are reviewed at length with the patient today.  Concerns regarding medicines are outlined above.  No orders of the defined types were placed in this encounter.   No orders of the defined types were placed in this encounter.   There are no Patient Instructions on file for this visit.   Signed, Loyalty Brashier [place], MD  09/15/2022 4:30 PM     [facility]

## 2022-09-15 ENCOUNTER — Encounter: Payer: Self-pay | Admitting: Cardiology

## 2022-09-15 ENCOUNTER — Ambulatory Visit: Payer: Medicare HMO | Attending: Cardiology | Admitting: Cardiology

## 2022-09-15 VITALS — BP 124/74 | HR 78 | Ht 64.0 in | Wt 138.2 lb

## 2022-09-15 DIAGNOSIS — I2510 Atherosclerotic heart disease of native coronary artery without angina pectoris: Secondary | ICD-10-CM

## 2022-09-15 DIAGNOSIS — E785 Hyperlipidemia, unspecified: Secondary | ICD-10-CM

## 2022-09-15 DIAGNOSIS — I4819 Other persistent atrial fibrillation: Secondary | ICD-10-CM

## 2022-09-15 DIAGNOSIS — I10 Essential (primary) hypertension: Secondary | ICD-10-CM | POA: Diagnosis not present

## 2022-09-15 NOTE — Patient Instructions (Signed)
Medication Instructions:  Continue same medications *If you need a refill on your cardiac medications before your next appointment, please call your pharmacy*   Lab Work: None ordered   Testing/Procedures: None ordered   Follow-Up: At [facility] you and your health needs are our priority.  As part of our continuing mission to provide you with exceptional heart care, we have created designated Provider Care Teams.  These Care Teams include your primary Cardiologist (physician) and Advanced Practice Providers (APPs -  Physician Assistants and Nurse Practitioners) who all work together to provide you with the care you need, when you need it.  We recommend signing up for the patient portal called "MyChart".  Sign up information is provided on this After Visit Summary.  MyChart is used to connect with patients for Virtual Visits (Telemedicine).  Patients are able to view lab/test results, encounter notes, upcoming appointments, etc.  Non-urgent messages can be sent to your provider as well.   To learn more about what you can do with MyChart, go to [email or web address].    Your next appointment:  1 year    Call in March to schedule June appointment     Provider:  Dr.Jordan's PA

## 2022-11-04 ENCOUNTER — Telehealth: Payer: Self-pay | Admitting: Hematology and Oncology

## 2022-11-04 ENCOUNTER — Inpatient Hospital Stay: Payer: Medicare HMO | Admitting: Hematology and Oncology

## 2022-12-30 ENCOUNTER — Inpatient Hospital Stay: Payer: Medicare HMO | Admitting: Hematology and Oncology

## 2023-01-19 ENCOUNTER — Telehealth: Payer: Self-pay | Admitting: Hematology and Oncology

## 2023-01-19 NOTE — Telephone Encounter (Signed)
Per Staff message on 01/14/23; I called the patient and left a voice mail with the rescheduled appointment date and time.

## 2023-01-21 ENCOUNTER — Inpatient Hospital Stay: Payer: Medicare HMO | Attending: Hematology and Oncology | Admitting: Hematology and Oncology

## 2023-02-22 ENCOUNTER — Other Ambulatory Visit: Payer: Self-pay | Admitting: Cardiology

## 2023-02-23 NOTE — Telephone Encounter (Signed)
Prescription refill request for Eliquis received. Indication:afib Last office visit:6/24 Scr:1.05  2/24 Age: 89 Weight:62.7  kg  Prescription refilled

## 2023-05-20 ENCOUNTER — Other Ambulatory Visit: Payer: Self-pay | Admitting: Student

## 2023-06-03 ENCOUNTER — Other Ambulatory Visit: Payer: Self-pay | Admitting: Cardiology

## 2023-09-10 ENCOUNTER — Other Ambulatory Visit: Payer: Self-pay | Admitting: Cardiology

## 2023-09-16 ENCOUNTER — Encounter: Payer: Self-pay | Admitting: Nurse Practitioner

## 2023-09-16 ENCOUNTER — Ambulatory Visit: Attending: Nurse Practitioner | Admitting: Nurse Practitioner

## 2023-09-16 VITALS — BP 112/72 | HR 81 | Ht 63.0 in | Wt 143.8 lb

## 2023-09-16 DIAGNOSIS — E785 Hyperlipidemia, unspecified: Secondary | ICD-10-CM

## 2023-09-16 DIAGNOSIS — I2510 Atherosclerotic heart disease of native coronary artery without angina pectoris: Secondary | ICD-10-CM

## 2023-09-16 DIAGNOSIS — I4819 Other persistent atrial fibrillation: Secondary | ICD-10-CM | POA: Diagnosis not present

## 2023-09-16 DIAGNOSIS — I10 Essential (primary) hypertension: Secondary | ICD-10-CM | POA: Diagnosis not present

## 2023-09-16 NOTE — Patient Instructions (Signed)
 Medication Instructions:  Your physician recommends that you continue on your current medications as directed. Please refer to the Current Medication list given to you today.  *If you need a refill on your cardiac medications before your next appointment, please call your pharmacy*  Follow-Up: At [facility], you and your health needs are our priority.  As part of our continuing mission to provide you with exceptional heart care, our providers are all part of one team.  This team includes your primary Cardiologist (physician) and Advanced Practice Providers or APPs (Physician Assistants and Nurse Practitioners) who all work together to provide you with the care you need, when you need it.  Your next appointment:   1 year(s)  Provider:   Peter [place], MD    We recommend signing up for the patient portal called "MyChart".  Sign up information is provided on this After Visit Summary.  MyChart is used to connect with patients for Virtual Visits (Telemedicine).  Patients are able to view lab/test results, encounter notes, upcoming appointments, etc.  Non-urgent messages can be sent to your provider as well.   To learn more about what you can do with MyChart, go to [email or web address].

## 2023-09-16 NOTE — Progress Notes (Signed)
 Office Visit    Patient Name: Kristie Hicks Date of Encounter: 09/16/2023  Primary Care Provider:  Azalia Leo, MD Primary Cardiologist:  Peter Swaziland, MD  Chief Complaint    89 year old female with a history of coronary artery calcification noted on CT, persistent atrial fibrillation, hypertension, and hyperlipidemia who presents for follow-up related to CAD and atrial fibrillation.  Past Medical History    Past Medical History:  Diagnosis Date   HL (hearing loss)    Hyperlipidemia    Hypertension    Rapid heart rate    Past Surgical History:  Procedure Laterality Date   CATARACT EXTRACTION      Allergies  Allergies  Allergen Reactions   Mobic [Meloxicam] Swelling    Feet and ankles swell   Robaxin [Methocarbamol] Swelling    Feet and ankles swell     Labs/Other Studies Reviewed    The following studies were reviewed today:  Cardiac Studies & Procedures   ______________________________________________________________________________________________     ECHOCARDIOGRAM  ECHOCARDIOGRAM COMPLETE 01/16/2021  Narrative ECHOCARDIOGRAM REPORT    Patient Name:   Kristie Hicks Date of Exam: 01/16/2021 Medical Rec #:  [id number]              Height:       64.5 in Accession #:    [id number]             Weight:       142.0 lb Date of Birth:  September 27, 1930               BSA:          1.700 m Patient Age:    89 years               BP:           139/63 mmHg Patient Gender: F                      HR:           79 bpm. Exam Location:  Inpatient  Procedure: 2D Echo, Color Doppler and Cardiac Doppler  Indications:    NSTEMI  History:        Patient has no prior history of Echocardiogram examinations. Acute MI; Risk Factors:Hypertension and Dyslipidemia.  Sonographer:    Melissa Morford RDCS (AE, PE) Referring Phys: [id number] VASUNDHRA RATHORE  IMPRESSIONS   1. There is a 19 mmHg systolic mid-cavitary gradient in the LV from hyperdynamic LV  contraction. Left ventricular ejection fraction, by estimation, is >75%. The left ventricle has hyperdynamic function. The left ventricle has no regional wall motion abnormalities. There is mild left ventricular hypertrophy. Left ventricular diastolic parameters are consistent with Grade I diastolic dysfunction (impaired relaxation). Elevated left ventricular end-diastolic pressure. 2. Right ventricular systolic function is normal. The right ventricular size is normal. 3. Left atrial size was moderately dilated. 4. The mitral valve is abnormal. Trivial mitral valve regurgitation. Mild mitral stenosis. The mean mitral valve gradient is 5.0 mmHg with average heart rate of 75 bpm. Moderate mitral annular calcification. 5. The aortic valve is tricuspid. Aortic valve regurgitation is not visualized. Mild aortic valve sclerosis is present, with no evidence of aortic valve stenosis.  Comparison(s): No prior Echocardiogram.  FINDINGS Left Ventricle: There is a 19 mmHg systolic mid-cavitary gradient in the LV from hyperdynamic LV contraction. Left ventricular ejection fraction, by estimation, is >75%. The left ventricle has hyperdynamic function. The left ventricle has no regional wall motion  abnormalities. The left ventricular internal cavity size was normal in size. There is mild left ventricular hypertrophy. Left ventricular diastolic parameters are consistent with Grade I diastolic dysfunction (impaired relaxation). Elevated left ventricular end-diastolic pressure.  Right Ventricle: The right ventricular size is normal. No increase in right ventricular wall thickness. Right ventricular systolic function is normal.  Left Atrium: Left atrial size was moderately dilated.  Right Atrium: Right atrial size was normal in size.  Pericardium: There is no evidence of pericardial effusion.  Mitral Valve: The mitral valve is abnormal. There is moderate calcification of the posterior mitral valve leaflet(s).  Moderate mitral annular calcification. Trivial mitral valve regurgitation. Mild mitral valve stenosis. The mean mitral valve gradient is 5.0 mmHg with average heart rate of 75 bpm.  Tricuspid Valve: The tricuspid valve is grossly normal. Tricuspid valve regurgitation is trivial.  Aortic Valve: The aortic valve is tricuspid. Aortic valve regurgitation is not visualized. Mild aortic valve sclerosis is present, with no evidence of aortic valve stenosis.  Pulmonic Valve: The pulmonic valve was grossly normal. Pulmonic valve regurgitation is trivial.  Aorta: The aortic root and ascending aorta are structurally normal, with no evidence of dilitation.  Venous: The inferior vena cava was not well visualized.  IAS/Shunts: No atrial level shunt detected by color flow Doppler.   LEFT VENTRICLE PLAX 2D LVIDd:         4.50 cm     Diastology LVIDs:         3.00 cm     LV e' medial:   4.13 cm/s LV PW:         0.80 cm     LV E/e' medial: 31.7 LV IVS:        0.70 cm LVOT diam:     2.00 cm LV SV:         62 LV SV Index:   37 LVOT Area:     3.14 cm  LV Volumes (MOD) LV vol d, MOD A2C: 47.6 ml LV vol d, MOD A4C: 44.6 ml LV vol s, MOD A2C: 19.8 ml LV vol s, MOD A4C: 20.9 ml LV SV MOD A2C:     27.8 ml LV SV MOD A4C:     44.6 ml LV SV MOD BP:      26.0 ml  RIGHT VENTRICLE RV S prime:     18.50 cm/s TAPSE (M-mode): 1.9 cm  LEFT ATRIUM             Index       RIGHT ATRIUM           Index LA diam:        3.90 cm 2.29 cm/m  RA Area:     16.50 cm LA Vol (A2C):   60.0 ml 35.29 ml/m RA Volume:   38.90 ml  22.88 ml/m LA Vol (A4C):   70.3 ml 41.35 ml/m LA Biplane Vol: 69.4 ml 40.82 ml/m AORTIC VALVE LVOT Vmax:   117.00 cm/s LVOT Vmean:  82.000 cm/s LVOT VTI:    0.198 m  AORTA Ao Asc diam: 2.80 cm  MITRAL VALVE MV Area (PHT): 2.72 cm     SHUNTS MV Mean grad:  5.0 mmHg     Systemic VTI:  0.20 m MV Decel Time: 279 msec     Systemic Diam: 2.00 cm MV E velocity: 131.00 cm/s MV A  velocity: 168.00 cm/s MV E/A ratio:  0.78  Dinah Franco MD Electronically signed by Dinah Franco MD Signature Date/Time: 01/16/2021/10:24:45 AM  Final          ______________________________________________________________________________________________     Recent Labs: No results found for requested labs within last 365 days.  Recent Lipid Panel    Component Value Date/Time   CHOL 179 03/28/2021 1436   TRIG 51 03/28/2021 1436   HDL 65 03/28/2021 1436   CHOLHDL 2.8 03/28/2021 1436   CHOLHDL 2.9 01/17/2021 0121   VLDL 10 01/17/2021 0121   LDLCALC 104 (H) 03/28/2021 1436    History of Present Illness    89 year old female with the above past medical history including coronary artery calcification noted on CT, persistent atrial fibrillation, hypertension, and hyperlipidemia.  She was hospitalized in October 2022 in the setting of chest pain, high-sensitivity troponin peaked at 360.  CT of the chest was negative for PE but did show dense coronary calcifications.  It was felt that her elevated troponin was likely due to demand ischemia in the setting of uncontrolled hypertension.  She elected medical management.  Echocardiogram at the time showed EF greater than 75%, hyperdynamic LV function, mild LVH, G1 DD, normal RV systolic function, mild mitral valve stenosis, mild aortic valve sclerosis with no evidence of aortic stenosis.  She has a history of persistent atrial fibrillation, rate controlled, on Eliquis .  She was last seen in the office on 09/15/2022 and was stable from a cardiac standpoint.  She denied symptoms concerning for angina.  She presents today for follow-up  accompanied by her daughter.  Since her last visit she has done well from a cardiac standpoint.  She denies any symptoms concerning for angina.  She has stable dependent bilateral lower extremity edema, unchanged from prior visits.  She denies palpitations, dizziness, dyspnea, PND, orthopnea, weight gain.  She  is getting ready celebrate her 73st birthday.  Overall, she reports feeling well.  Home Medications    Current Outpatient Medications  Medication Sig Dispense Refill   atorvastatin  (LIPITOR) 40 MG tablet TAKE 1 TABLET BY MOUTH EVERY DAY 90 tablet 3   carvedilol  (COREG ) 6.25 MG tablet TAKE 1 TABLET BY MOUTH 2 TIMES DAILY WITH A MEAL. 180 tablet 1   cholecalciferol (VITAMIN D3) 25 MCG (1000 UNIT) tablet Take 1,000 Units by mouth daily.     ELIQUIS  5 MG TABS tablet TAKE 1 TABLET BY MOUTH TWICE A DAY 180 tablet 1   hydrochlorothiazide  (HYDRODIURIL ) 25 MG tablet Take 1 tablet (25 mg total) by mouth daily. 90 tablet 1   nitroGLYCERIN  (NITROSTAT ) 0.4 MG SL tablet Place 1 tablet (0.4 mg total) under the tongue every 5 (five) minutes as needed for chest pain. 25 tablet 2   polyethylene glycol powder (MIRALAX ) powder Take 17 g by mouth daily. (Patient taking differently: Take 17 g by mouth daily as needed for mild constipation.) 255 g 0   vitamin B-12 (CYANOCOBALAMIN ) 1000 MCG tablet Take 1,000 mcg by mouth daily.     No current facility-administered medications for this visit.     Review of Systems    She denies chest pain, palpitations, dyspnea, pnd, orthopnea, n, v, dizziness, syncope, weight gain, or early satiety. All other systems reviewed and are otherwise negative except as noted above.   Physical Exam    VS:  BP 112/72   Pulse 81   Ht 5\' 3"  (1.6 m)   Wt 143 lb 12.8 oz (65.2 kg)   SpO2 98%   BMI 25.47 kg/m  GEN: Well nourished, well developed, in no acute distress. HEENT: normal. Neck: Supple, no JVD, carotid bruits,  or masses. Cardiac: IRIR, no murmurs, rubs, or gallops. No clubbing, cyanosis, nonpitting bilateral ankle edema.  Radials/DP/PT 2+ and equal bilaterally.  Respiratory:  Respirations regular and unlabored, clear to auscultation bilaterally. GI: Soft, nontender, nondistended, BS + x 4. MS: no deformity or atrophy. Skin: warm and dry, no rash. Neuro:  Strength and  sensation are intact. Psych: Normal affect.  Accessory Clinical Findings    ECG personally reviewed by me today - EKG Interpretation Date/Time:  Wednesday September 16 2023 15:57:58 EDT Ventricular Rate:  81 PR Interval:    QRS Duration:  66 QT Interval:  346 QTC Calculation: 401 R Axis:   80  Text Interpretation: Atrial fibrillation Low voltage QRS No significant change since last tracing Confirmed by Marlana Silvan ([id number]) on 09/16/2023 4:02:05 PM  - no acute changes.   Lab Results  Component Value Date   WBC 3.3 (L) 06/02/2022   HGB 11.0 (L) 06/02/2022   HCT 32.0 (L) 06/02/2022   MCV 94.7 06/02/2022   PLT 223 06/02/2022   Lab Results  Component Value Date   CREATININE 1.05 (H) 06/02/2022   BUN 22 06/02/2022   NA 139 06/02/2022   K 4.1 06/02/2022   CL 102 06/02/2022   CO2 31 06/02/2022   Lab Results  Component Value Date   ALT 14 06/02/2022   AST 19 06/02/2022   ALKPHOS 62 06/02/2022   BILITOT 0.9 06/02/2022   Lab Results  Component Value Date   CHOL 179 03/28/2021   HDL 65 03/28/2021   LDLCALC 104 (H) 03/28/2021   TRIG 51 03/28/2021   CHOLHDL 2.8 03/28/2021    No results found for: "HGBA1C"  Assessment & Plan    1. CAD: Evidence of coronary artery calcification on prior CT.  She declined further ischemic evaluation, opted for medical therapy.  Echocardiogram at the time showed EF greater than 75%, hyperdynamic LV function, mild LVH, G1 DD, normal RV systolic function, mild mitral valve stenosis, mild aortic valve sclerosis with no evidence of aortic stenosis. Continue carvedilol , Lipitor.   2. Persistent atrial fibrillation: Rate controlled.  She has stable dependent nonpitting bilateral ankle edema, generally euvolemic and well compensated on exam.  Continue carvedilol , Eliquis .  3. Hypertension: BP well controlled. Continue current antihypertensive regimen.   4. Hyperlipidemia: LDL was 103 in 05/2023.  Continue Lipitor.  5. Disposition: Follow-up in 1 year,  sooner if needed.       Jude Norton, NP 09/16/2023, 4:17 PM

## 2023-12-06 ENCOUNTER — Other Ambulatory Visit: Payer: Self-pay | Admitting: Cardiology

## 2024-01-11 ENCOUNTER — Other Ambulatory Visit: Payer: Self-pay | Admitting: General Surgery

## 2024-01-11 ENCOUNTER — Telehealth: Payer: Self-pay

## 2024-01-11 DIAGNOSIS — Z01818 Encounter for other preprocedural examination: Secondary | ICD-10-CM

## 2024-01-11 NOTE — Telephone Encounter (Signed)
   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

## 2024-01-17 NOTE — Telephone Encounter (Signed)
 Patient with diagnosis of atrial fibrillation on Eliquis  for anticoagulation.    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 The patient's score is based upon: CHF History: 0 HTN History: 1 Diabetes History: 0 Stroke History: 0 Vascular Disease History: 1 Age Score: 2 Gender Score: 1   No current labs  Patient has not had an Afib/aflutter ablation or Watchman within the last 3 months or DCCV within the last 30 days   Will wait for updated labs to make final clearance decision  **This guidance is not considered finalized until pre-operative APP has relayed final recommendations.**

## 2024-01-18 NOTE — Telephone Encounter (Signed)
 Pre-op team,   Patient will need CBC and BMP prior to pharmacy recommendations. Will you please order?  Thank you!  Barnie HERO. Danely Bayliss, DNP, NP-C  01/18/2024, 5:08 PM [facility] [address] Office [phone] Fax [phone]

## 2024-01-18 NOTE — Telephone Encounter (Signed)
 Left message to call back tomorrow to schedule labs.

## 2024-01-19 NOTE — Telephone Encounter (Signed)
 2nd attempt to reach pt to set up labs to done for preop. BMET/CBC. I will place the orders and release them to [facility].

## 2024-01-19 NOTE — Addendum Note (Signed)
 Addended by: WYNETTA NIELS HERO on: 01/19/2024 10:12 AM   Modules accepted: Orders

## 2024-01-20 NOTE — Telephone Encounter (Signed)
 3rd attempt to reach the pt to set up labs to be done needed for preop clearance. Lab order have been placed and release to [employer]. Just ask pt to call back and let us  know when she is going for labs so we may keep an eye out for the results.   I will update the requesting office and remove from the preop call back pool at this time.

## 2024-01-26 ENCOUNTER — Telehealth: Payer: Self-pay | Admitting: Student in an Organized Health Care Education/Training Program

## 2024-01-26 LAB — CBC
Hematocrit: 34.4 % (ref 34.0–46.6)
Hemoglobin: 11.5 g/dL (ref 11.1–15.9)
MCH: 32.1 pg (ref 26.6–33.0)
MCHC: 33.4 g/dL (ref 31.5–35.7)
MCV: 96 fL (ref 79–97)
Platelets: 208 x10E3/uL (ref 150–450)
RBC: 3.58 x10E6/uL — ABNORMAL LOW (ref 3.77–5.28)
RDW: 13.3 % (ref 11.7–15.4)
WBC: 3 x10E3/uL — ABNORMAL LOW (ref 3.4–10.8)

## 2024-01-26 LAB — BASIC METABOLIC PANEL WITH GFR
BUN/Creatinine Ratio: 24 (ref 12–28)
BUN: 27 mg/dL (ref 10–36)
CO2: 24 mmol/L (ref 20–29)
Calcium: 10.1 mg/dL (ref 8.7–10.3)
Chloride: 98 mmol/L (ref 96–106)
Creatinine, Ser: 1.12 mg/dL — ABNORMAL HIGH (ref 0.57–1.00)
Glucose: 102 mg/dL — ABNORMAL HIGH (ref 70–99)
Potassium: 2.9 mmol/L — CL (ref 3.5–5.2)
Sodium: 139 mmol/L (ref 134–144)
eGFR: 46 mL/min/1.73 — ABNORMAL LOW (ref >59)

## 2024-01-26 LAB — LAB REPORT - SCANNED: EGFR: 46

## 2024-01-26 NOTE — Telephone Encounter (Signed)
 Outpatient labs returned with a K of 2.9. Attempt made to call patient at [phone], but was unable to reach. I will message ordering practitioner.  Merlene Blood, MD MS  Cardiology [facility]

## 2024-01-28 ENCOUNTER — Ambulatory Visit: Payer: Self-pay | Admitting: Pharmacist Clinician (PhC)/ Clinical Pharmacy Specialist

## 2024-01-28 DIAGNOSIS — E876 Hypokalemia: Secondary | ICD-10-CM

## 2024-01-28 MED ORDER — POTASSIUM CHLORIDE CRYS ER 20 MEQ PO TBCR: EXTENDED_RELEASE_TABLET | ORAL | 0 refills | Status: DC

## 2024-01-28 NOTE — Telephone Encounter (Signed)
 Labs showed potassium at 2.9.  Will supplement and repeat lab on Monday.

## 2024-02-01 NOTE — Telephone Encounter (Signed)
 2nd request received.  Will route to the preop pool for the preop app to review

## 2024-02-03 LAB — BASIC METABOLIC PANEL WITH GFR
BUN/Creatinine Ratio: 22 (ref 12–28)
BUN: 22 mg/dL (ref 10–36)
CO2: 22 mmol/L (ref 20–29)
Calcium: 9.9 mg/dL (ref 8.7–10.3)
Chloride: 106 mmol/L (ref 96–106)
Creatinine, Ser: 1.02 mg/dL — ABNORMAL HIGH (ref 0.57–1.00)
Glucose: 104 mg/dL — ABNORMAL HIGH (ref 70–99)
Potassium: 5.1 mmol/L (ref 3.5–5.2)
Sodium: 141 mmol/L (ref 134–144)
eGFR: 52 mL/min/1.73 — ABNORMAL LOW (ref >59)

## 2024-02-04 MED ORDER — POTASSIUM CHLORIDE CRYS ER 20 MEQ PO TBCR: EXTENDED_RELEASE_TABLET | ORAL | Status: AC

## 2024-02-04 NOTE — Telephone Encounter (Signed)
 Left message on pt machine to decrease KCL to 1 tablet daily Repeat labs in 1 week

## 2024-02-05 NOTE — Telephone Encounter (Signed)
 Patient with diagnosis of A Fib on Eliquis  for anticoagulation.    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 The patient's score is based upon: CHF History: 0 HTN History: 1 Diabetes History: 0 Stroke History: 0 Vascular Disease History: 1 Age Score: 2 Gender Score: 1     CrCl 37 ml/min Platelet count 208K  Patient has not  had an Afib/aflutter ablation in the last 3 months, DCCV within the last 4 weeks or a watchman implanted in the last 45 days   Per office protocol, patient can hold Eliquis  for 2 days prior to procedure.    **This guidance is not considered finalized until pre-operative APP has relayed final recommendations.**

## 2024-02-05 NOTE — Telephone Encounter (Signed)
 Tried contacting patient to scheduled TELEVISIT no answer left a detailed vm to call back and schedule

## 2024-02-05 NOTE — Telephone Encounter (Signed)
   Name: Kristie Hicks  DOB: Aug 04, 1930  MRN: [id number]  Primary Cardiologist: Peter Swaziland, MD   Preoperative team, please contact this patient and set up a phone call appointment for further preoperative risk assessment. Please obtain consent and complete medication review. Thank you for your help.  I confirm that guidance regarding antiplatelet and oral anticoagulation therapy has been completed and, if necessary, noted below.  Patient has not  had an Afib/aflutter ablation in the last 3 months, DCCV within the last 4 weeks or a watchman implanted in the last 45 days    Per office protocol, patient can hold Eliquis  for 2 days prior to procedure.    I also confirmed the patient resides in the state of [place] . As per [facility] Medical Board telemedicine laws, the patient must reside in the state in which the provider is licensed.   Lamarr Satterfield, NP 02/05/2024, 8:34 AM [facility]

## 2024-02-09 NOTE — Telephone Encounter (Signed)
2nd attempt to reach the pt

## 2024-02-09 NOTE — Telephone Encounter (Signed)
Left message to call back and schedule tele pre op appt

## 2024-02-10 ENCOUNTER — Telehealth (HOSPITAL_BASED_OUTPATIENT_CLINIC_OR_DEPARTMENT_OTHER): Payer: Self-pay | Admitting: *Deleted

## 2024-02-10 NOTE — Telephone Encounter (Signed)
 S/w the pt's daughter Lennette [facility]) who has scheduled tele preop appt 02/17/24. Per pt's daughter Dr. Aron is waiting for clearance before scheduling procedure. Med rec and consent are done.

## 2024-02-10 NOTE — Telephone Encounter (Signed)
 S/w the pt's daughter Lennette [facility]) who has scheduled tele preop appt 02/17/24. Per pt's daughter Dr. Aron is waiting for clearance before scheduling procedure. Med rec and consent are done.       Patient Consent for Virtual Visit        Kristie Hicks has provided verbal consent on 02/10/2024 for a virtual visit (video or telephone).   CONSENT FOR VIRTUAL VISIT FOR:  Kristie Hicks  By participating in this virtual visit I agree to the following:  I hereby voluntarily request, consent and authorize [facility] and its employed or contracted physicians, physician assistants, nurse practitioners or other licensed health care professionals (the Practitioner), to provide me with telemedicine health care services (the "Services) as deemed necessary by the treating Practitioner. I acknowledge and consent to receive the Services by the Practitioner via telemedicine. I understand that the telemedicine visit will involve communicating with the Practitioner through live audiovisual communication technology and the disclosure of certain medical information by electronic transmission. I acknowledge that I have been given the opportunity to request an in-person assessment or other available alternative prior to the telemedicine visit and am voluntarily participating in the telemedicine visit.  I understand that I have the right to withhold or withdraw my consent to the use of telemedicine in the course of my care at any time, without affecting my right to future care or treatment, and that the Practitioner or I may terminate the telemedicine visit at any time. I understand that I have the right to inspect all information obtained and/or recorded in the course of the telemedicine visit and may receive copies of available information for a reasonable fee.  I understand that some of the potential risks of receiving the Services via telemedicine include:  Delay or interruption in  medical evaluation due to technological equipment failure or disruption; Information transmitted may not be sufficient (e.g. poor resolution of images) to allow for appropriate medical decision making by the Practitioner; and/or  In rare instances, security protocols could fail, causing a breach of personal health information.  Furthermore, I acknowledge that it is my responsibility to provide information about my medical history, conditions and care that is complete and accurate to the best of my ability. I acknowledge that Practitioner's advice, recommendations, and/or decision may be based on factors not within their control, such as incomplete or inaccurate data provided by me or distortions of diagnostic images or specimens that may result from electronic transmissions. I understand that the practice of medicine is not an exact science and that Practitioner makes no warranties or guarantees regarding treatment outcomes. I acknowledge that a copy of this consent can be made available to me via my patient portal [facility] MyChart), or I can request a printed copy by calling the office of [facility].    I understand that my insurance will be billed for this visit.   I have read or had this consent read to me. I understand the contents of this consent, which adequately explains the benefits and risks of the Services being provided via telemedicine.  I have been provided ample opportunity to ask questions regarding this consent and the Services and have had my questions answered to my satisfaction. I give my informed consent for the services to be provided through the use of telemedicine in my medical care

## 2024-02-10 NOTE — Telephone Encounter (Signed)
 Pts daughter Lennette returning call. She asks you call her. [phone]

## 2024-02-17 ENCOUNTER — Ambulatory Visit: Attending: Cardiology | Admitting: Physician Assistant

## 2024-02-17 DIAGNOSIS — Z01810 Encounter for preprocedural cardiovascular examination: Secondary | ICD-10-CM

## 2024-02-17 NOTE — Progress Notes (Signed)
 Virtual Visit via Telephone Note   Because of Annelle Behrendt co-morbid illnesses, she is at least at moderate risk for complications without adequate follow up.  This format is felt to be most appropriate for this patient at this time.  Due to technical limitations with video connection (technology), today's appointment will be conducted as an audio only telehealth visit, and Anira Senegal verbally agreed to proceed in this manner.   All issues noted in this document were discussed and addressed.  No physical exam could be performed with this format.  Evaluation Performed:  Preoperative cardiovascular risk assessment _____________   Date:  02/17/2024   Patient ID:  Triva Hueber, DOB [birth year], MRN [id number] Patient Location:  Home Provider location:   Office  Primary Care Provider:  Stephane Leita DEL, MD Primary Cardiologist:  Peter Jordan, MD  Chief Complaint / Patient Profile   89 y.o. y/o female with a h/o coronary artery calcification noted on CT, persistent Afib, hypertension, hyperlipidemia who is pending right lower leg squamous cell carcinoma wide local excision and presents today for telephonic preoperative cardiovascular risk assessment.  History of Present Illness    Alaysia Lightle is a 89 y.o. female who presents via audio/video conferencing for a telehealth visit today.  Pt was last seen in cardiology clinic on 09/16/2023 by Dr. Jordan.  At that time Emmaline Wahba was doing well .  The patient is now pending procedure as outlined above. Since her last visit, she has a walker and a cane and has some mobility issues due to osteoarthritis in her knees.  She tends to do more cerebral tasks like puzzle books to keep her mind sharp.  She at one point had a very low potassium and her supplementation was increased.  She then had a potassium of 5.1 and it was subsequently decreased.  We have arranged for follow-up lab work in the next few days to  check her potassium.  Currently she is on 20 mEq daily.  She has not complained of any cardiovascular issues.  She is not particularly symptomatic with her atrial fibrillation and blood pressure has been stable.  She does meet minimum METS for clearance.   Per office protocol, patient can hold Eliquis  for 2 days prior to procedure. Please resume when medically safe to do so.   Past Medical History    Past Medical History:  Diagnosis Date   HL (hearing loss)    Hyperlipidemia    Hypertension    Rapid heart rate    Past Surgical History:  Procedure Laterality Date   CATARACT EXTRACTION      Allergies  Allergies  Allergen Reactions   Mobic [Meloxicam] Swelling    Feet and ankles swell   Robaxin [Methocarbamol] Swelling    Feet and ankles swell    Home Medications    Prior to Admission medications   Medication Sig Start Date End Date Taking? Authorizing Provider  atorvastatin  (LIPITOR) 40 MG tablet TAKE 1 TABLET BY MOUTH EVERY DAY 04/22/21   Goodrich, Callie E, PA-C  carvedilol  (COREG ) 6.25 MG tablet TAKE 1 TABLET BY MOUTH TWICE A DAY WITH FOOD 12/08/23   Jordan, Peter M, MD  cholecalciferol (VITAMIN D3) 25 MCG (1000 UNIT) tablet Take 1,000 Units by mouth daily.    [provider]  ELIQUIS  5 MG TABS tablet TAKE 1 TABLET BY MOUTH TWICE A DAY 09/10/23   Jordan, Peter M, MD  hydrochlorothiazide  (HYDRODIURIL ) 25 MG tablet Take 1 tablet (25  mg total) by mouth daily. Patient not taking: Reported on 02/10/2024 06/04/23   Jordan, Peter M, MD  nitroGLYCERIN  (NITROSTAT ) 0.4 MG SL tablet Place 1 tablet (0.4 mg total) under the tongue every 5 (five) minutes as needed for chest pain. 03/28/21   Goodrich, Callie E, PA-C  polyethylene glycol powder (MIRALAX ) powder Take 17 g by mouth daily. Patient taking differently: Take 17 g by mouth daily as needed for mild constipation. 01/10/15   Palumbo, April, MD  potassium chloride SA (KLOR-CON M) 20 MEQ tablet Take 1 tablet daily 02/04/24    Jordan, Peter M, MD  vitamin B-12 (CYANOCOBALAMIN ) 1000 MCG tablet Take 1,000 mcg by mouth daily.    [provider]    Physical Exam    Vital Signs:  Zebedee Bellman Desha does not have vital signs available for review today.  Given telephonic nature of communication, physical exam is limited. AAOx3. NAD. Normal affect.  Speech and respirations are unlabored.  Accessory Clinical Findings    None  Assessment & Plan    1.  Preoperative Cardiovascular Risk Assessment:  Ms. Triska's perioperative risk of a major cardiac event is 0.4% according to the Revised Cardiac Risk Index (RCRI).  Therefore, she is at low risk for perioperative complications.   Her functional capacity is fair at 4.06 METs according to the Duke Activity Status Index (DASI). Recommendations: According to ACC/AHA guidelines, no further cardiovascular testing needed.  The patient may proceed to surgery at acceptable risk.   Antiplatelet and/or Anticoagulation Recommendations:  Eliquis  (Apixaban ) can be held for 2 days prior to surgery.  Please resume post op when felt to be safe.     The patient was advised that if she develops new symptoms prior to surgery to contact our office to arrange for a follow-up visit, and she verbalized understanding.   A copy of this note will be routed to requesting surgeon.  Time:   Today, I have spent 7 minutes with the patient with telehealth technology discussing medical history, symptoms, and management plan.     Orren LOISE Fabry, PA-C  02/17/2024, 1:40 PM

## 2024-02-26 LAB — BASIC METABOLIC PANEL WITH GFR
BUN/Creatinine Ratio: 21 (ref 12–28)
BUN: 22 mg/dL (ref 10–36)
CO2: 22 mmol/L (ref 20–29)
Calcium: 10.5 mg/dL — ABNORMAL HIGH (ref 8.7–10.3)
Chloride: 106 mmol/L (ref 96–106)
Creatinine, Ser: 1.07 mg/dL — ABNORMAL HIGH (ref 0.57–1.00)
Glucose: 101 mg/dL — ABNORMAL HIGH (ref 70–99)
Potassium: 4.4 mmol/L (ref 3.5–5.2)
Sodium: 143 mmol/L (ref 134–144)
eGFR: 49 mL/min/1.73 — ABNORMAL LOW (ref >59)

## 2024-02-28 ENCOUNTER — Ambulatory Visit: Payer: Self-pay | Admitting: Physician Assistant

## 2024-03-01 NOTE — Telephone Encounter (Signed)
 LVM asking pt to call our office to discuss lab results.

## 2024-03-02 NOTE — Telephone Encounter (Signed)
 Pts daughter requesting a c/b to discuss results.

## 2024-03-11 NOTE — Progress Notes (Signed)
 Surgical Instructions   Your procedure is scheduled on Wednesday December 3. Report to [facility] Main Entrance A at 11:30 A.M., then check in with the Admitting office. Any questions or running late day of surgery: call [phone]  Questions prior to your surgery date: call [phone], Monday-Friday, 8am-4pm. If you experience any cold or flu symptoms such as cough, fever, chills, shortness of breath, etc. between now and your scheduled surgery, please notify us  at the above number.     Remember:  Do not eat after midnight the night before your surgery   You may drink clear liquids until 10:30am the morning of your surgery.   Clear liquids allowed are: Water, Non-Citrus Juices (without pulp), Carbonated Beverages, Clear Tea (no milk, honey, etc.), Black Coffee Only (NO MILK, CREAM OR POWDERED CREAMER of any kind), and Gatorade.    Take these medicines the morning of surgery with A SIP OF WATER  carvedilol  (COREG )   May take these medicines IF NEEDED: nitroGLYCERIN  (NITROSTAT ) - If you take this medicine, please call [phone] before coming to the hospital.        THE NIGHT BEFORE SURGERY TAKE ONE FLEETS ENEMA AS ORDERED BY DR. BYERLY. FLEETS ENEMA CAN BE         PURCHASED OVER THE COUNTER.           AS DIRECTED BY YOUR CARDIOLOGIST AND DR. BYERLY, HOLD YOUR ELIQUIS  2 DAYS PRIOR TO SURGERY.          YOUR LAST DOSE WILL BE ON SUNDAY, NOVEMBER 30.   One week prior to surgery, STOP taking any Aspirin  (unless otherwise instructed by your surgeon) Aleve, Naproxen, Ibuprofen, Motrin, Advil, Goody's, BC's, all herbal medications, fish oil, and non-prescription vitamins.                     Do NOT Smoke (Tobacco/Vaping) for 24 hours prior to your procedure.  If you use a CPAP at night, you may bring your mask/headgear for your overnight stay.   You will be asked to remove any contacts, glasses, piercing's, hearing aid's, dentures/partials prior to surgery. Please bring cases  for these items if needed.    Patients discharged the day of surgery will not be allowed to drive home, and someone needs to stay with them for 24 hours.  SURGICAL WAITING ROOM VISITATION Patients may have no more than 2 support people in the waiting area - these visitors may rotate.   Pre-op nurse will coordinate an appropriate time for 1 ADULT support person, who may not rotate, to accompany patient in pre-op.  Children under the age of 71 must have an adult with them who is not the patient and must remain in the main waiting area with an adult.  If the patient needs to stay at the hospital during part of their recovery, the visitor guidelines for inpatient rooms apply.  Please refer to the [facility] website for the visitor guidelines for any additional information.   If you received a COVID test during your pre-op visit  it is requested that you wear a mask when out in public, stay away from anyone that may not be feeling well and notify your surgeon if you develop symptoms. If you have been in contact with anyone that has tested positive in the last 10 days please notify you surgeon.      Pre-operative CHG Bathing Instructions   You can play a key role in reducing the risk of infection after surgery.  Your skin needs to be as free of germs as possible. You can reduce the number of germs on your skin by washing with CHG (chlorhexidine gluconate) soap before surgery. CHG is an antiseptic soap that kills germs and continues to kill germs even after washing.   DO NOT use if you have an allergy to chlorhexidine/CHG or antibacterial soaps. If your skin becomes reddened or irritated, stop using the CHG and notify one of our RNs at [phone].              TAKE A SHOWER THE NIGHT BEFORE SURGERY   Please keep in mind the following:  DO NOT shave, including legs and underarms, 48 hours prior to surgery.   You may shave your face before/day of surgery.  Place clean sheets on your bed the  night before surgery Use a clean washcloth (not used since being washed) for shower. DO NOT sleep with pet's night before surgery.  CHG Shower Instructions:  Wash your face and private area with normal soap. If you choose to wash your hair, wash first with your normal shampoo.  After you use shampoo/soap, rinse your hair and body thoroughly to remove shampoo/soap residue.  Turn the water OFF and apply half the bottle of CHG soap to a CLEAN washcloth.  Apply CHG soap ONLY FROM YOUR NECK DOWN TO YOUR TOES (washing for 3-5 minutes)  DO NOT use CHG soap on face, private areas, open wounds, or sores.  Pay special attention to the area where your surgery is being performed.  If you are having back surgery, having someone wash your back for you may be helpful. Wait 2 minutes after CHG soap is applied, then you may rinse off the CHG soap.  Pat dry with a clean towel  Put on clean pajamas    Additional instructions for the day of surgery: If you choose, you may shower the morning of surgery with an antibacterial soap.  DO NOT APPLY any lotions, deodorants, cologne, or perfumes.   Do not wear jewelry or makeup Do not wear nail polish, gel polish, artificial nails, or any other type of covering on natural nails (fingers and toes) Do not bring valuables to the hospital. [facility] is not responsible for valuables/personal belongings. Put on clean/comfortable clothes.  Please brush your teeth.  Ask your nurse before applying any prescription medications to the skin.

## 2024-03-14 ENCOUNTER — Encounter (HOSPITAL_COMMUNITY): Payer: Self-pay

## 2024-03-14 ENCOUNTER — Other Ambulatory Visit: Payer: Self-pay

## 2024-03-14 ENCOUNTER — Encounter (HOSPITAL_COMMUNITY)
Admission: RE | Admit: 2024-03-14 | Discharge: 2024-03-14 | Disposition: A | Source: Ambulatory Visit | Attending: General Surgery

## 2024-03-14 VITALS — BP 142/74 | HR 70 | Temp 98.0°F | Resp 18 | Ht 63.0 in | Wt 148.0 lb

## 2024-03-14 DIAGNOSIS — Z01812 Encounter for preprocedural laboratory examination: Secondary | ICD-10-CM | POA: Insufficient documentation

## 2024-03-14 DIAGNOSIS — I2510 Atherosclerotic heart disease of native coronary artery without angina pectoris: Secondary | ICD-10-CM | POA: Insufficient documentation

## 2024-03-14 DIAGNOSIS — I10 Essential (primary) hypertension: Secondary | ICD-10-CM | POA: Diagnosis not present

## 2024-03-14 DIAGNOSIS — E785 Hyperlipidemia, unspecified: Secondary | ICD-10-CM | POA: Insufficient documentation

## 2024-03-14 DIAGNOSIS — I4891 Unspecified atrial fibrillation: Secondary | ICD-10-CM | POA: Diagnosis not present

## 2024-03-14 DIAGNOSIS — I700 Atherosclerosis of aorta: Secondary | ICD-10-CM | POA: Diagnosis not present

## 2024-03-14 DIAGNOSIS — C44722 Squamous cell carcinoma of skin of right lower limb, including hip: Secondary | ICD-10-CM | POA: Diagnosis not present

## 2024-03-14 DIAGNOSIS — Z01818 Encounter for other preprocedural examination: Secondary | ICD-10-CM

## 2024-03-14 LAB — CBC
HCT: 35.5 % — ABNORMAL LOW (ref 36.0–46.0)
Hemoglobin: 11.3 g/dL — ABNORMAL LOW (ref 12.0–15.0)
MCH: 31.2 pg (ref 26.0–34.0)
MCHC: 31.8 g/dL (ref 30.0–36.0)
MCV: 98.1 fL (ref 80.0–100.0)
Platelets: 195 K/uL (ref 150–400)
RBC: 3.62 MIL/uL — ABNORMAL LOW (ref 3.87–5.11)
RDW: 14.9 % (ref 11.5–15.5)
WBC: 4 K/uL (ref 4.0–10.5)
nRBC: 0 % (ref 0.0–0.2)

## 2024-03-14 LAB — BASIC METABOLIC PANEL WITH GFR
Anion gap: 8 (ref 5–15)
BUN: 23 mg/dL (ref 8–23)
CO2: 32 mmol/L (ref 22–32)
Calcium: 9.9 mg/dL (ref 8.9–10.3)
Chloride: 100 mmol/L (ref 98–111)
Creatinine, Ser: 1.21 mg/dL — ABNORMAL HIGH (ref 0.44–1.00)
GFR, Estimated: 42 mL/min — ABNORMAL LOW (ref >60)
Glucose, Bld: 102 mg/dL — ABNORMAL HIGH (ref 70–99)
Potassium: 3.5 mmol/L (ref 3.5–5.1)
Sodium: 140 mmol/L (ref 135–145)

## 2024-03-14 NOTE — H&P (Signed)
 Chief Complaint: New Consultation ( invasive squamous cell carcinoma RLE)   History of Present Illness: Kristie Hicks is a 89 y.o. female who is seen today as an office consultation for evaluation of New Consultation ( invasive squamous cell carcinoma RLE)     History of Present Illness Kristie Hicks is a 89 year old female who presents with a bothersome lesion on her leg. She was referred by Dr. Jordan for evaluation of her heart condition management.   She has a lesion on her leg approximately the size of a quarter, present for less than a year. The lesion drains but is not painful, although it has bled once. There is a small knot near the lesion that has been present for a while. It has gotten larger.   She has a history of atrial fibrillation, diagnosed via EKG, and is currently taking Eliquis . No recent palpitations, but she experiences occasional chest pain and shortness of breath. Her last cardiology follow-up was in June. She has not had a recent echocardiogram, but a past one was satisfactory.   She experienced a heart attack in the past, managed with medication rather than stents. She uses a cane for ambulation and finds it challenging to walk long distances, such as in large parking lots, but manages well at home.       Pathology 10/27/23    Review of Systems: A complete review of systems was obtained from the patient.  I have reviewed this information and discussed as appropriate with the patient.  See HPI as well for other ROS.   Review of Systems  All other systems reviewed and are negative.     Medical History: Past Medical History      Past Medical History:  Diagnosis Date   HL (hearing loss)     Hyperlipidemia     Hypertension     NSTEMI (non-ST elevated myocardial infarction) (CMS/HHS-HCC)     Rapid heart rate          Problem List     Patient Active Problem List  Diagnosis   Squamous cell carcinoma of skin of lower extremity, right   H/O non-ST  elevation myocardial infarction (NSTEMI)   Primary hypertension   Other hyperlipidemia   On continuous oral anticoagulation        Past Surgical History       Past Surgical History:  Procedure Laterality Date   CATARACT EXTRACTION            Allergies       Allergies  Allergen Reactions   Meloxicam Swelling      Feet and ankles swell   Methocarbamol Swelling      Feet and ankles swell        Medications Ordered Prior to Encounter        Current Outpatient Medications on File Prior to Visit  Medication Sig Dispense Refill   apixaban  (ELIQUIS ) 5 mg tablet Take 5 mg by mouth 2 (two) times daily       atorvastatin  (LIPITOR) 80 MG tablet Take 80 mg by mouth at bedtime       carvediloL  (COREG ) 6.25 MG tablet Take 6.25 mg by mouth 2 (two) times daily with meals       cholecalciferol (VITAMIN D3) 1000 unit tablet Take 1,000 Units by mouth once daily       cyanocobalamin  (VITAMIN B12) 1000 MCG tablet Take 1,000 mcg by mouth once daily       hydroCHLOROthiazide  (HYDRODIURIL ) 25 MG tablet  Take 25 mg by mouth once daily       nitroGLYcerin  (NITROSTAT ) 0.4 MG SL tablet Place 0.4 mg under the tongue every 5 (five) minutes as needed for Chest pain       atorvastatin  (LIPITOR) 40 MG tablet Take 40 mg by mouth once daily (Patient not taking: Reported on 01/11/2024)        No current facility-administered medications on file prior to visit.        Family History       Family History  Problem Relation Age of Onset   Sudden death (unexpected death due to unknown cause) Father     Coronary Artery Disease (Blocked arteries around heart) Sister     Coronary Artery Disease (Blocked arteries around heart) Brother     Diabetes Neg Hx          Tobacco Use History  Social History       Tobacco Use  Smoking Status Never  Smokeless Tobacco Never        Social History  Social History        Socioeconomic History   Marital status: Widowed  Tobacco Use   Smoking status: Never    Smokeless tobacco: Never  Vaping Use   Vaping status: Unknown  Substance and Sexual Activity   Alcohol use: Never   Drug use: Never    Social Drivers of Health        Housing Stability: Unknown (01/11/2024)    Housing Stability Vital Sign     Homeless in the Last Year: No        Objective:       Vitals:      BP: (!) 140/84  Pulse: 71  Resp: 14  Temp: 36.8 C (98.2 F)  SpO2: 98%  Weight: 63 kg (138 lb 12.8 oz)  Height: 157.5 cm (5' 2)  PainSc: 0-No pain    Body mass index is 25.39 kg/m.   Head:   Normocephalic and atraumatic.  Eyes:    Conjunctivae are normal. Pupils are equal, round, and reactive to light. No scleral icterus.  Neck:   Normal range of motion. Neck supple. No tracheal deviation present. No thyromegaly present.  Resp:No respiratory distress, normal effort. Abd:      Abdomen is soft, non distended and non tender. No masses are palpable.  There is no rebound and no guarding.  Neurological: Alert and oriented to person, place, and time. Coordination normal.  Skin:    Skin is warm and dry. No rash noted. No diaphoretic. No erythema. No pallor. Lesion on right mid shin with some pigmented regions and some scaling.  Around 3 cm wide.   Psychiatric: Normal mood and affect. Normal behavior. Judgment and thought content normal.      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 No expected lymph node metastasis. Suitable for surgery. - Perform wide local excision with advancement flap closure. Main risks: wound  dehiscence, infection, bleeding.  Discussed other risks such as cardiac risk like MI, rapid atrial fibrillation, stroke,  pulmonary complications, admission to the hospital.  I reviewed that it that would be is is that there may be additional dressing changes that would have to be done.  And I also reviewed the risk of prolonged wound healing. - Coordinate with Dr. Jordan for Eliquis  management perioperatively due to atrial fibrillation. Minor heart complication risk due to short procedure duration. - Schedule surgery post-cardiology clearance. - Advise against strenuous activity and heavy lifting for two weeks postoperatively. - Instruct on leg elevation and compression socks to reduce swelling. - Monitor for wound dehiscence, infection, and bleeding postoperatively.

## 2024-03-14 NOTE — Progress Notes (Signed)
 PCP - Leita Stephane COME Cardiologist - Maude Berry COME  PPM/ICD - denies Device Orders -  Rep Notified -   Chest x-ray - na EKG - 09/16/23 Stress Test - denies ECHO - 01/16/21 Cardiac Cath - denies  Sleep Study - denies CPAP - no  Fasting Blood Sugar - na Checks Blood Sugar _____ times a day  Last dose of GLP1 agonist-  na GLP1 instructions:   Blood Thinner Instructions:        AS DIRECTED BY YOUR CARDIOLOGIST AND DR. BYERLY, HOLD YOUR ELIQUIS  2 DAYS PRIOR TO SURGERY.          YOUR LAST DOSE WILL BE ON SUNDAY, NOVEMBER 30.  Aspirin  Instructions:na  ERAS Protcol - clear liquids until 1030 PRE-SURGERY Ensure or G2- no  COVID TEST- na   Anesthesia review: yes - cardiac clearance 02/17/24, hx HTN,afib,HLD.  Patient denies shortness of breath, fever, cough and chest pain at PAT appointment   All instructions explained to the patient, with a verbal understanding of the material. Patient agrees to go over the instructions while at home for a better understanding.  The opportunity to ask questions was provided.

## 2024-03-15 ENCOUNTER — Encounter (HOSPITAL_COMMUNITY): Payer: Self-pay

## 2024-03-15 NOTE — Anesthesia Preprocedure Evaluation (Signed)
 Anesthesia Evaluation  Patient identified by MRN, date of birth, ID band Patient awake    Reviewed: Allergy & Precautions, NPO status , Patient's Chart, lab work & pertinent test results, reviewed documented beta blocker date and time   History of Anesthesia Complications Negative for: history of anesthetic complications  Airway Mallampati: II  TM Distance: >3 FB Neck ROM: Full    Dental  (+) Lower Dentures, Upper Dentures   Pulmonary neg pulmonary ROS   Pulmonary exam normal        Cardiovascular hypertension, Pt. on home beta blockers and Pt. on medications + CAD and + Past MI  Normal cardiovascular exam+ dysrhythmias Atrial Fibrillation    '22 TTE - EF >75%. There is mild left ventricular hypertrophy. Grade I diastolic dysfunction (impaired relaxation). Left atrial size was moderately dilated. Trivial mitral valve regurgitation. Mild mitral stenosis. The mean mitral valve gradient is 5.0 mmHg with average heart rate of 75 bpm. Mild aortic valve sclerosis is present, with no evidence of aortic valve stenosis.     Neuro/Psych  PSYCHIATRIC DISORDERS     Dementia negative neurological ROS     GI/Hepatic negative GI ROS, Neg liver ROS,,,  Endo/Other  negative endocrine ROS    Renal/GU negative Renal ROS     Musculoskeletal negative musculoskeletal ROS (+)    Abdominal   Peds  Hematology  (+) Blood dyscrasia, anemia  On eliquis     Anesthesia Other Findings   Reproductive/Obstetrics                              Anesthesia Physical Anesthesia Plan  ASA: 3  Anesthesia Plan: General   Post-op Pain Management: Tylenol  PO (pre-op)*   Induction: Intravenous  PONV Risk Score and Plan: 3 and Treatment may vary due to age or medical condition, Ondansetron and TIVA  Airway Management Planned: LMA  Additional Equipment: None  Intra-op Plan:   Post-operative Plan: Extubation in  OR  Informed Consent: I have reviewed the patients History and Physical, chart, labs and discussed the procedure including the risks, benefits and alternatives for the proposed anesthesia with the patient or authorized representative who has indicated his/her understanding and acceptance.     Dental advisory given and Consent reviewed with POA  Plan Discussed with: CRNA, Anesthesiologist and Surgeon  Anesthesia Plan Comments: (PAT note written 03/15/2024 by Froilan Mclean, PA-C.  )         Anesthesia Quick Evaluation

## 2024-03-15 NOTE — Progress Notes (Signed)
 Anesthesia Chart Review:   Case: [id number] Date/Time: 03/16/24 1316   Procedure: CREATION, FLAP, ROTATION (Right) - WIDE LOCAL EXCISION ADVANCEMENT FLAP CLOSURE RIGHT LOWER LEG SQUAMOUS CELL CARCINOMA   Anesthesia type: General   Pre-op diagnosis: SQUAMOUS CELL CARCINOMA   [facility]: [facility] ROOM 02 / [facility]   Surgeons: Aron Shoulders, MD       DISCUSSION: Patient is a 89 year old female scheduled for the above procedure.  History includes never smoker, HTN, HLD, afib (diagnosed 09/27/2021), coronary calcification (on CTA imaging 01/15/2021 during admission for CP/possible demand ischemia in setting of uncontrolled HTN, medically managed), hearing loss, skin cancer (SCC RLE).   Preoperative cardiology input outlined on 02/17/2024 by Lucien Blanc, PA-C: [name]'s perioperative risk of a major cardiac event is 0.4% according to the Revised Cardiac Risk Index (RCRI).  Therefore, she is at low risk for perioperative complications.   Her functional capacity is fair at 4.06 METs according to the Duke Activity Status Index (DASI). Recommendations: According to ACC/AHA guidelines, no further cardiovascular testing needed.  The patient may proceed to surgery at acceptable risk.   Antiplatelet and/or Anticoagulation Recommendations:   Eliquis  (Apixaban ) can be held for 2 days prior to surgery.  Please resume post op when felt to be safe.  Anesthesia team to evaluate on the day of surgery.    VS: BP (!) 142/74   Pulse 70   Temp 36.7 C   Resp 18   Ht 5' 3 (1.6 m)   Wt 67.1 kg   SpO2 100%   BMI 26.22 kg/m    PROVIDERS: Stephane Leita DEL, MD is PCP  Jordan, Peter, MD is cardiologist   LABS: Labs reviewed: Acceptable for surgery. (all labs ordered are listed, but only abnormal results are displayed)  Labs Reviewed  BASIC METABOLIC PANEL WITH GFR - Abnormal; Notable for the following components:      Result Value   Glucose, Bld 102 (*)    Creatinine, Ser 1.21 (*)    GFR, Estimated 42 (*)     All other components within normal limits  CBC - Abnormal; Notable for the following components:   RBC 3.62 (*)    Hemoglobin 11.3 (*)    HCT 35.5 (*)    All other components within normal limits     IMAGES: CTA Chest 01/15/2021: IMPRESSION: 1. No evidence of a pulmonary embolism. 2. No acute findings. Lungs demonstrate mild dependent subsegmental atelectasis, but no evidence of pneumonia or edema. 3. Mild cardiomegaly. Small pericardial effusion. Dense coronary artery calcifications. 4. Enlarged main pulmonary artery similar to the prior CT suggesting pulmonary artery hypertension. 5. Aortic atherosclerosis. 6. Two small benign stable lung nodules.  No follow-up recommended. - Aortic Atherosclerosis (ICD10-I70.0).    EKG: 09/16/2023: Atrial fibrillation at 81 bpm Low voltage QRS  No significant change since last tracing  Confirmed by Daneen Perkins ([id number]) on 09/16/2023 4:02:05 PM   CV: Echo 01/16/2021: IMPRESSIONS   1. There is a 19 mmHg systolic mid-cavitary gradient in the LV from  hyperdynamic LV contraction. Left ventricular ejection fraction, by  estimation, is >75%. The left ventricle has hyperdynamic function. The  left ventricle has no regional wall motion  abnormalities. There is mild left ventricular hypertrophy. Left  ventricular diastolic parameters are consistent with Grade I diastolic  dysfunction (impaired relaxation). Elevated left ventricular end-diastolic  pressure.   2. Right ventricular systolic function is normal. The right ventricular  size is normal.   3. Left atrial size was moderately dilated.  4. The mitral valve is abnormal. Trivial mitral valve regurgitation. Mild  mitral stenosis. The mean mitral valve gradient is 5.0 mmHg with average  heart rate of 75 bpm. Moderate mitral annular calcification.   5. The aortic valve is tricuspid. Aortic valve regurgitation is not  visualized. Mild aortic valve sclerosis is present, with no evidence of   aortic valve stenosis.  - Comparison(s): No prior Echocardiogram.    Past Medical History:  Diagnosis Date   A-fib (HCC) 09/27/2021   Coronary artery calcification 01/15/2021   by imaging   HL (hearing loss)    Hyperlipidemia    Hypertension    Rapid heart rate     Past Surgical History:  Procedure Laterality Date   CATARACT EXTRACTION      MEDICATIONS:  ELIQUIS  5 MG TABS tablet   atorvastatin  (LIPITOR) 80 MG tablet   carvedilol  (COREG ) 6.25 MG tablet   cholecalciferol (VITAMIN D3) 25 MCG (1000 UNIT) tablet   hydrochlorothiazide  (HYDRODIURIL ) 25 MG tablet   nitroGLYCERIN  (NITROSTAT ) 0.4 MG SL tablet   polyethylene glycol powder (MIRALAX ) powder   potassium chloride  SA (KLOR-CON  M) 20 MEQ tablet   vitamin B-12 (CYANOCOBALAMIN ) 1000 MCG tablet   No current facility-administered medications for this encounter.    Isaiah Ruder, PA-C Surgical Short Stay/Anesthesiology [facility] Phone [phone] [facility] Phone [phone] 03/15/2024 10:35 AM

## 2024-03-16 ENCOUNTER — Ambulatory Visit (HOSPITAL_COMMUNITY): Payer: Self-pay | Admitting: Anesthesiology

## 2024-03-16 ENCOUNTER — Encounter (HOSPITAL_COMMUNITY)
Admission: RE | Disposition: A | Discharge status: Home / Self Care | Payer: Self-pay | Source: Home / Self Care | Attending: General Surgery

## 2024-03-16 ENCOUNTER — Encounter (HOSPITAL_COMMUNITY): Payer: Self-pay | Admitting: General Surgery

## 2024-03-16 ENCOUNTER — Ambulatory Visit (HOSPITAL_COMMUNITY): Payer: Self-pay | Admitting: Vascular Surgery

## 2024-03-16 ENCOUNTER — Ambulatory Visit (HOSPITAL_COMMUNITY)
Admission: RE | Admit: 2024-03-16 | Discharge: 2024-03-16 | Disposition: A | Discharge status: Home / Self Care | Attending: General Surgery | Admitting: General Surgery

## 2024-03-16 HISTORY — PX: ROTATION FLAP: SHX6211

## 2024-03-16 SURGERY — CREATION, FLAP, ROTATION
Anesthesia: General | Laterality: Right

## 2024-03-16 MED ORDER — CEFAZOLIN SODIUM-DEXTROSE 2-4 GM/100ML-% IV SOLN
2.0000 g | INTRAVENOUS | Status: AC
  Administered 2024-03-16: 2 g via INTRAVENOUS

## 2024-03-16 MED ORDER — ORAL CARE MOUTH RINSE: 15.0000 mL | Freq: Once | OROMUCOSAL | Status: AC

## 2024-03-16 MED ORDER — ONDANSETRON HCL 4 MG/2ML IJ SOLN
INTRAMUSCULAR | Status: DC | PRN
  Administered 2024-03-16: 4 mg via INTRAVENOUS

## 2024-03-16 MED ORDER — CHLORHEXIDINE GLUCONATE 0.12 % MT SOLN: 15.0000 mL | Freq: Once | OROMUCOSAL | Status: AC

## 2024-03-16 MED ORDER — LIDOCAINE HCL 1 % IJ SOLN
INTRAMUSCULAR | Status: DC | PRN
  Administered 2024-03-16: 17 mL via INTRAMUSCULAR

## 2024-03-16 MED ORDER — PROPOFOL 10 MG/ML IV BOLUS
INTRAVENOUS | Status: DC | PRN
  Administered 2024-03-16: 80 mg via INTRAVENOUS

## 2024-03-16 MED ORDER — FENTANYL CITRATE (PF) 100 MCG/2ML IJ SOLN
INTRAMUSCULAR | Status: AC
  Filled 2024-03-16: qty 2

## 2024-03-16 MED ORDER — BUPIVACAINE-EPINEPHRINE (PF) 0.25% -1:200000 IJ SOLN
INTRAMUSCULAR | Status: AC
  Filled 2024-03-16: qty 30

## 2024-03-16 MED ORDER — 0.9 % SODIUM CHLORIDE (POUR BTL) OPTIME
TOPICAL | Status: DC | PRN
  Administered 2024-03-16: 1000 mL

## 2024-03-16 MED ORDER — ACETAMINOPHEN 500 MG PO TABS
ORAL_TABLET | ORAL | Status: AC
  Administered 2024-03-16: 1000 mg via ORAL
  Filled 2024-03-16: qty 2

## 2024-03-16 MED ORDER — PHENYLEPHRINE 80 MCG/ML (10ML) SYRINGE FOR IV PUSH (FOR BLOOD PRESSURE SUPPORT)
PREFILLED_SYRINGE | INTRAVENOUS | Status: DC | PRN
  Administered 2024-03-16 (×4): 80 ug via INTRAVENOUS

## 2024-03-16 MED ORDER — PROPOFOL 10 MG/ML IV BOLUS
INTRAVENOUS | Status: AC
  Filled 2024-03-16: qty 20

## 2024-03-16 MED ORDER — LACTATED RINGERS IV SOLN: INTRAVENOUS | Status: DC

## 2024-03-16 MED ORDER — LIDOCAINE HCL 1 % IJ SOLN
INTRAMUSCULAR | Status: AC
  Filled 2024-03-16: qty 20

## 2024-03-16 MED ORDER — CHLORHEXIDINE GLUCONATE CLOTH 2 % EX PADS: 6.0000 | MEDICATED_PAD | Freq: Once | CUTANEOUS | Status: DC

## 2024-03-16 MED ORDER — FLEET ENEMA RE ENEM: 1.0000 | ENEMA | Freq: Once | RECTAL | Status: DC

## 2024-03-16 MED ORDER — LIDOCAINE 2% (20 MG/ML) 5 ML SYRINGE
INTRAMUSCULAR | Status: AC
  Filled 2024-03-16: qty 5

## 2024-03-16 MED ORDER — CHLORHEXIDINE GLUCONATE 0.12 % MT SOLN
OROMUCOSAL | Status: AC
  Administered 2024-03-16: 15 mL via OROMUCOSAL
  Filled 2024-03-16: qty 15

## 2024-03-16 MED ORDER — CEFAZOLIN SODIUM-DEXTROSE 2-4 GM/100ML-% IV SOLN
INTRAVENOUS | Status: AC
  Filled 2024-03-16: qty 100

## 2024-03-16 MED ORDER — DEXAMETHASONE SOD PHOSPHATE PF 10 MG/ML IJ SOLN
INTRAMUSCULAR | Status: DC | PRN
  Administered 2024-03-16: 4 mg via INTRAVENOUS

## 2024-03-16 MED ORDER — OXYCODONE HCL 5 MG PO TABS: 5.0000 mg | ORAL_TABLET | Freq: Four times a day (QID) | ORAL | 0 refills | Status: AC | PRN

## 2024-03-16 MED ORDER — ONDANSETRON HCL 4 MG/2ML IJ SOLN
INTRAMUSCULAR | Status: AC
  Filled 2024-03-16: qty 2

## 2024-03-16 MED ORDER — ACETAMINOPHEN 500 MG PO TABS: 1000.0000 mg | ORAL_TABLET | ORAL | Status: AC

## 2024-03-16 MED ORDER — LIDOCAINE 2% (20 MG/ML) 5 ML SYRINGE
INTRAMUSCULAR | Status: DC | PRN
  Administered 2024-03-16: 60 mg via INTRAVENOUS

## 2024-03-16 SURGICAL SUPPLY — 49 items
BAG COUNTER SPONGE SURGICOUNT (BAG) ×1 IMPLANT
BLADE SURG 10 STRL SS (BLADE) ×1 IMPLANT
BNDG COHESIVE 4X5 TAN STRL LF (GAUZE/BANDAGES/DRESSINGS) IMPLANT
BNDG GAUZE DERMACEA FLUFF 4 (GAUZE/BANDAGES/DRESSINGS) ×1 IMPLANT
CANISTER SUCTION 3000ML PPV (SUCTIONS) ×1 IMPLANT
CHLORAPREP W/TINT 26 (MISCELLANEOUS) ×1 IMPLANT
CLIP TI MEDIUM 24 (CLIP) ×1 IMPLANT
CLIP TI WIDE RED SMALL 24 (CLIP) ×1 IMPLANT
CNTNR URN SCR LID CUP LEK RST (MISCELLANEOUS) ×3 IMPLANT
COVER MAYO STAND STRL (DRAPES) IMPLANT
COVER PROBE W GEL 5X96 (DRAPES) ×1 IMPLANT
COVER SURGICAL LIGHT HANDLE (MISCELLANEOUS) ×1 IMPLANT
DRAPE HALF SHEET 40X57 (DRAPES) ×1 IMPLANT
DRAPE LAPAROSCOPIC ABDOMINAL (DRAPES) ×1 IMPLANT
DRSG CUTIMED SORBACT 7X9 (GAUZE/BANDAGES/DRESSINGS) IMPLANT
DRSG TEGADERM 4X4.75 (GAUZE/BANDAGES/DRESSINGS) ×2 IMPLANT
ELECTRODE REM PT RTRN 9FT ADLT (ELECTROSURGICAL) ×1 IMPLANT
GAUZE SPONGE 2X2 8PLY STRL LF (GAUZE/BANDAGES/DRESSINGS) ×1 IMPLANT
GAUZE XEROFORM 1X8 LF (GAUZE/BANDAGES/DRESSINGS) IMPLANT
GLOVE BIO SURGEON STRL SZ 6 (GLOVE) ×1 IMPLANT
GLOVE INDICATOR 6.5 STRL GRN (GLOVE) ×1 IMPLANT
GOWN STRL REUS W/ TWL LRG LVL3 (GOWN DISPOSABLE) ×2 IMPLANT
GOWN STRL REUS W/ TWL XL LVL3 (GOWN DISPOSABLE) ×2 IMPLANT
KIT BASIN OR (CUSTOM PROCEDURE TRAY) ×1 IMPLANT
KIT TURNOVER KIT B (KITS) ×1 IMPLANT
MARKER SKIN DUAL TIP RULER LAB (MISCELLANEOUS) ×1 IMPLANT
NDL 18GX1X1/2 (RX/OR ONLY) (NEEDLE) ×1 IMPLANT
NDL 22X1.5 STRL (OR ONLY) (MISCELLANEOUS) ×1 IMPLANT
NDL FILTER BLUNT 18X1 1/2 (NEEDLE) IMPLANT
NDL HYPO 25GX1X1/2 BEV (NEEDLE) ×1 IMPLANT
PACK GENERAL/GYN (CUSTOM PROCEDURE TRAY) ×1 IMPLANT
PACK UNIVERSAL I (CUSTOM PROCEDURE TRAY) IMPLANT
PAD ARMBOARD POSITIONER FOAM (MISCELLANEOUS) ×2 IMPLANT
PENCIL SMOKE EVACUATOR (MISCELLANEOUS) ×1 IMPLANT
POWDER MYRIAD MORCLLS FINE 500 (Miscellaneous) IMPLANT
SOLN 0.9% NACL POUR BTL 1000ML (IV SOLUTION) ×1 IMPLANT
SPECIMEN JAR MEDIUM (MISCELLANEOUS) ×1 IMPLANT
SPIKE FLUID TRANSFER (MISCELLANEOUS) ×1 IMPLANT
STOCKINETTE IMPERVIOUS 9X36 MD (GAUZE/BANDAGES/DRESSINGS) ×1 IMPLANT
STRIP CLOSURE SKIN 1/2X4 (GAUZE/BANDAGES/DRESSINGS) ×1 IMPLANT
SUT ETHILON 2 0 PSLX (SUTURE) ×1 IMPLANT
SUT MNCRL AB 4-0 PS2 18 (SUTURE) ×1 IMPLANT
SUT SILK 2 0 PERMA HAND 18 BK (SUTURE) ×1 IMPLANT
SUT SILK 2 0 SH (SUTURE) IMPLANT
SUT VIC AB 2-0 SH 27XBRD (SUTURE) ×2 IMPLANT
SUT VIC AB 3-0 SH 27X BRD (SUTURE) ×2 IMPLANT
SYR CONTROL 10ML LL (SYRINGE) ×2 IMPLANT
TOWEL GREEN STERILE (TOWEL DISPOSABLE) ×1 IMPLANT
TOWEL GREEN STERILE FF (TOWEL DISPOSABLE) ×1 IMPLANT

## 2024-03-16 NOTE — Anesthesia Procedure Notes (Signed)
 Procedure Name: LMA Insertion Date/Time: 03/16/2024 2:33 PM  Performed by: Erick Fitz, CRNAPre-anesthesia Checklist: Patient identified, Emergency Drugs available, Suction available, Patient being monitored and Timeout performed Patient Re-evaluated:Patient Re-evaluated prior to induction Oxygen Delivery Method: Circle system utilized Preoxygenation: Pre-oxygenation with 100% oxygen Induction Type: IV induction Ventilation: Mask ventilation without difficulty LMA: LMA inserted LMA Size: 3.0 Number of attempts: 1 Placement Confirmation: positive ETCO2 and CO2 detector Tube secured with: Tape (SECURED WITH 1/2' WHITE SILK TAPE) Dental Injury: Teeth and Oropharynx as per pre-operative assessment  Comments: PATIENT EDENTULOUS

## 2024-03-16 NOTE — Discharge Instructions (Addendum)
 [facility] Office Phone Number [phone]   POST OP INSTRUCTIONS  Always review your discharge instruction sheet given to you by the facility where your surgery was performed.  IF YOU HAVE DISABILITY OR FAMILY LEAVE FORMS, YOU MUST BRING THEM TO THE OFFICE FOR PROCESSING.  DO NOT GIVE THEM TO YOUR DOCTOR.  Take 2 tylenol (acetominophen) three times a day for 3 days.  If you still have pain, add ibuprofen with food in between if able to take this (if you have kidney issues or stomach issues, do not take ibuprofen).  If both of those are not enough, add the narcotic pain pill.  If you find you are needing a lot of this overnight after surgery, call the next morning for a refill.   Take your usually prescribed medications unless otherwise directed If you need a refill on your pain medication, please contact your pharmacy.  They will contact our office to request authorization.  Prescriptions will not be filled after 5pm or on week-ends. You should eat very light the first 24 hours after surgery, such as soup, crackers, pudding, etc.  Resume your normal diet the day after surgery It is common to experience some constipation if taking pain medication after surgery.  Increasing fluid intake and taking a stool softener will usually help or prevent this problem from occurring.  A mild laxative (Milk of Magnesia or Miralax) should be taken according to package directions if there are no bowel movements after 48 hours. You may shower in 48 hours.  The surgical glue will flake off in 2-3 weeks.   ACTIVITIES:  No strenuous activity or heavy lifting for 1 week.   You may drive when you no longer are taking prescription pain medication, you can comfortably wear a seatbelt, and you can safely maneuver your car and apply brakes. RETURN TO WORK:  __________n/a_______________ Kristie Hicks should see your doctor in the office for a follow-up appointment approximately three-four weeks after your surgery.     WHEN TO CALL YOUR DOCTOR: Fever over 101.0 Nausea and/or vomiting. Extreme swelling or bruising. Continued bleeding from incision. Increased pain, redness, or drainage from the incision.  The clinic staff is available to answer your questions during regular business hours.  Please don't hesitate to call and ask to speak to one of the nurses for clinical concerns.  If you have a medical emergency, go to the nearest emergency room or call 911.  A surgeon from [facility] is always on call at the hospital.  For further questions, please visit [email or web address]

## 2024-03-16 NOTE — Op Note (Addendum)
 PRE-OPERATIVE DIAGNOSIS: cTxN0 right shin squamous cell carcinoma  POST-OPERATIVE DIAGNOSIS:  Same  PROCEDURE:  Procedure(s): Wide local excision 0.5 cm margins, advancement flap closure for defect 6.5 cm x 6.5 cm  SURGEON:  Surgeon(s): Jina Nephew, MD  ANESTHESIA:   local and general  DRAINS: none   LOCAL MEDICATIONS USED:  MARCAINE    and XYLOCAINE   SPECIMEN: right shin squamous cell carcinoma  FINDINGS:  margins grossly negative.    DISPOSITION OF SPECIMEN:  PATHOLOGY  COUNTS:  YES  PLAN OF CARE: Discharge to home after PACU  PATIENT DISPOSITION:  PACU - hemodynamically stable.   PROCEDURE:   Pt was identified in the holding area, taken to the OR, and placed supine on the OR table.  General anesthesia was induced.  Time out was performed according to the surgical safety checklist.  When all was correct, we continued.     The patient was placed into the supine position.  The right lower leg was prepped and draped in sterile fashion.  The lesion was identified and an incision line was marked out.  Local was administered under the lesion and the adjacent tissue.  A #10 blade was used to incise the skin.  The cautery was used to take the dissection down to the fascia.  The skin was marked in situ with orientation sutures.  The cautery was used to take the specimen off the fascia, and it was passed off the table.    Skin hooks were used to elevate the edges of the incision and the skin was freed up in all directions with the cautery to create advancement flaps.  This was pulled together in an longitudinal orientation. The skin was pulled together to check the tension. Due to the size of the defect, Myriad Morcells were placed in the cavity.   2-0 nylon horizontal mattress sutures were used to close the lesion.    The melanoma site was cleaned, dried, and dressed with Xeroform.  Needle, sponge, and instrument counts were correct.  The patient was awakened from anesthesia and taken  to the PACU in stable condition.

## 2024-03-16 NOTE — Transfer of Care (Signed)
 Immediate Anesthesia Transfer of Care Note  Patient: Neville Pauls [facility]  Procedure(s) Performed: CREATION, FLAP, ROTATION (Right)  Patient Location: PACU  Anesthesia Type:General  Level of Consciousness: awake, alert , and oriented  Airway & Oxygen Therapy: Patient Spontanous Breathing  Post-op Assessment: Report given to RN and Post -op Vital signs reviewed and stable  Post vital signs: Reviewed and stable  Last Vitals: see PACU postop VS flowsheet Vitals Value Taken Time  BP    Temp    Pulse    Resp    SpO2      Last Pain:  Vitals:   03/16/24 1205  PainSc: 0-No pain      Patients Stated Pain Goal: 0 (03/16/24 1132)  Complications: No notable events documented.

## 2024-03-16 NOTE — Interval H&P Note (Signed)
 History and Physical Interval Note:  03/16/2024 1:49 PM  Kristie Hicks  has presented today for surgery, with the diagnosis of SQUAMOUS CELL CARCINOMA.  The various methods of treatment have been discussed with the patient and family. After consideration of risks, benefits and other options for treatment, the patient has consented to  Procedure(s) with comments: CREATION, FLAP, ROTATION (Right) - WIDE LOCAL EXCISION ADVANCEMENT FLAP CLOSURE RIGHT LOWER LEG SQUAMOUS CELL CARCINOMA as a surgical intervention.  The patient's history has been reviewed, patient examined, no change in status, stable for surgery.  I have reviewed the patient's chart and labs.  Questions were answered to the patient's satisfaction.     Jina Nephew

## 2024-03-17 ENCOUNTER — Encounter (HOSPITAL_COMMUNITY): Payer: Self-pay | Admitting: General Surgery

## 2024-03-17 NOTE — Anesthesia Postprocedure Evaluation (Signed)
 Anesthesia Post Note  Patient: Kristie Hicks  Procedure(s) Performed: CREATION, FLAP, ROTATION (Right)     Patient location during evaluation: PACU Anesthesia Type: General Level of consciousness: awake and alert Pain management: pain level controlled Vital Signs Assessment: post-procedure vital signs reviewed and stable Respiratory status: spontaneous breathing, nonlabored ventilation and respiratory function stable Cardiovascular status: stable and blood pressure returned to baseline Anesthetic complications: no   No notable events documented.  Last Vitals:  Vitals:   03/16/24 1815 03/16/24 1830  BP: 134/79 (!) 150/81  Pulse: 80 83  Resp: (!) 21 17  Temp: (!) 35.9 C (!) 36.1 C  SpO2: 98% 98%    Last Pain:  Vitals:   03/16/24 1830  PainSc: 0-No pain                 Debby FORBES Like

## 2024-03-21 ENCOUNTER — Ambulatory Visit: Payer: Self-pay | Admitting: General Surgery

## 2024-03-21 LAB — SURGICAL PATHOLOGY

## 2024-04-01 ENCOUNTER — Other Ambulatory Visit: Payer: Self-pay | Admitting: *Deleted

## 2024-04-01 ENCOUNTER — Telehealth: Payer: Self-pay | Admitting: Cardiology

## 2024-04-01 DIAGNOSIS — I4819 Other persistent atrial fibrillation: Secondary | ICD-10-CM

## 2024-04-01 MED ORDER — APIXABAN 5 MG PO TABS: 5.0000 mg | ORAL_TABLET | Freq: Two times a day (BID) | ORAL | 2 refills | Status: AC

## 2024-04-01 NOTE — Telephone Encounter (Signed)
*  STAT* If patient is at the pharmacy, call can be transferred to refill team.   1. Which medications need to be refilled? (please list name of each medication and dose if known)  ELIQUIS 5 MG TABS tablet  2. Which pharmacy/location (including street and city if local pharmacy) is medication to be sent to? [id number] - [place], [place] - [address] AT [address]  3. Do they need a 30 day or 90 day supply? 90

## 2024-04-01 NOTE — Telephone Encounter (Signed)
 Eliquis  5mg  refill request received. Patient is 89 years old, weight-67.1kg, Crea-1.21 on 03/14/24, Diagnosis-Afib, and last seen by Orren Fabry on 02/17/24 via Tele Visit. Dose is appropriate based on dosing criteria. Will send in refill to requested pharmacy.
